# Patient Record
Sex: Female | Born: 1960 | Race: White | Hispanic: No | Marital: Married | Smoking: Former smoker
Health system: Southern US, Community
[De-identification: ages and names within clinical notes are randomized; demographics above are authoritative.]

## PROBLEM LIST (undated history)

## (undated) DIAGNOSIS — K760 Fatty (change of) liver, not elsewhere classified: Secondary | ICD-10-CM

## (undated) DIAGNOSIS — F1010 Alcohol abuse, uncomplicated: Secondary | ICD-10-CM

## (undated) DIAGNOSIS — M2550 Pain in unspecified joint: Secondary | ICD-10-CM

## (undated) DIAGNOSIS — R569 Unspecified convulsions: Secondary | ICD-10-CM

## (undated) DIAGNOSIS — N979 Female infertility, unspecified: Secondary | ICD-10-CM

## (undated) DIAGNOSIS — R600 Localized edema: Secondary | ICD-10-CM

## (undated) DIAGNOSIS — R002 Palpitations: Secondary | ICD-10-CM

## (undated) DIAGNOSIS — T7840XA Allergy, unspecified, initial encounter: Secondary | ICD-10-CM

## (undated) DIAGNOSIS — K219 Gastro-esophageal reflux disease without esophagitis: Secondary | ICD-10-CM

## (undated) DIAGNOSIS — M549 Dorsalgia, unspecified: Secondary | ICD-10-CM

## (undated) DIAGNOSIS — E785 Hyperlipidemia, unspecified: Secondary | ICD-10-CM

## (undated) DIAGNOSIS — N390 Urinary tract infection, site not specified: Secondary | ICD-10-CM

## (undated) DIAGNOSIS — F419 Anxiety disorder, unspecified: Secondary | ICD-10-CM

## (undated) DIAGNOSIS — K644 Residual hemorrhoidal skin tags: Secondary | ICD-10-CM

## (undated) DIAGNOSIS — I10 Essential (primary) hypertension: Secondary | ICD-10-CM

## (undated) DIAGNOSIS — R3989 Other symptoms and signs involving the genitourinary system: Secondary | ICD-10-CM

## (undated) HISTORY — DX: Dorsalgia, unspecified: M54.9

## (undated) HISTORY — DX: Female infertility, unspecified: N97.9

## (undated) HISTORY — DX: Other symptoms and signs involving the genitourinary system: R39.89

## (undated) HISTORY — PX: OTHER SURGICAL HISTORY: SHX169

## (undated) HISTORY — DX: Anxiety disorder, unspecified: F41.9

## (undated) HISTORY — DX: Residual hemorrhoidal skin tags: K64.4

## (undated) HISTORY — DX: Localized edema: R60.0

## (undated) HISTORY — DX: Hyperlipidemia, unspecified: E78.5

## (undated) HISTORY — DX: Unspecified convulsions: R56.9

## (undated) HISTORY — DX: Allergy, unspecified, initial encounter: T78.40XA

## (undated) HISTORY — DX: Palpitations: R00.2

## (undated) HISTORY — DX: Urinary tract infection, site not specified: N39.0

## (undated) HISTORY — DX: Fatty (change of) liver, not elsewhere classified: K76.0

## (undated) HISTORY — DX: Essential (primary) hypertension: I10

## (undated) HISTORY — DX: Alcohol abuse, uncomplicated: F10.10

## (undated) HISTORY — DX: Pain in unspecified joint: M25.50

## (undated) HISTORY — DX: Gastro-esophageal reflux disease without esophagitis: K21.9

---

## 1998-11-06 ENCOUNTER — Other Ambulatory Visit: Admission: RE | Admit: 1998-11-06 | Discharge: 1998-11-06 | Payer: Self-pay | Admitting: Obstetrics and Gynecology

## 2000-03-25 ENCOUNTER — Other Ambulatory Visit: Admission: RE | Admit: 2000-03-25 | Discharge: 2000-03-25 | Payer: Self-pay | Admitting: Obstetrics and Gynecology

## 2000-04-02 ENCOUNTER — Encounter: Payer: Self-pay | Admitting: Obstetrics and Gynecology

## 2000-04-02 ENCOUNTER — Encounter: Admission: RE | Admit: 2000-04-02 | Discharge: 2000-04-02 | Payer: Self-pay | Admitting: Obstetrics and Gynecology

## 2002-02-23 ENCOUNTER — Other Ambulatory Visit: Admission: RE | Admit: 2002-02-23 | Discharge: 2002-02-23 | Payer: Self-pay | Admitting: Obstetrics and Gynecology

## 2003-05-10 ENCOUNTER — Other Ambulatory Visit: Admission: RE | Admit: 2003-05-10 | Discharge: 2003-05-10 | Payer: Self-pay | Admitting: Obstetrics and Gynecology

## 2004-08-07 ENCOUNTER — Other Ambulatory Visit: Admission: RE | Admit: 2004-08-07 | Discharge: 2004-08-07 | Payer: Self-pay | Admitting: Obstetrics and Gynecology

## 2007-11-23 ENCOUNTER — Encounter: Admission: RE | Admit: 2007-11-23 | Discharge: 2007-11-23 | Payer: Self-pay | Admitting: Obstetrics and Gynecology

## 2009-06-06 ENCOUNTER — Encounter: Admission: RE | Admit: 2009-06-06 | Discharge: 2009-06-06 | Payer: Self-pay | Admitting: Obstetrics and Gynecology

## 2009-06-12 ENCOUNTER — Encounter: Admission: RE | Admit: 2009-06-12 | Discharge: 2009-06-12 | Payer: Self-pay | Admitting: Obstetrics and Gynecology

## 2011-03-11 ENCOUNTER — Other Ambulatory Visit: Payer: Self-pay | Admitting: Obstetrics and Gynecology

## 2011-03-11 DIAGNOSIS — Z803 Family history of malignant neoplasm of breast: Secondary | ICD-10-CM

## 2011-03-11 DIAGNOSIS — Z1231 Encounter for screening mammogram for malignant neoplasm of breast: Secondary | ICD-10-CM

## 2011-03-31 ENCOUNTER — Ambulatory Visit
Admission: RE | Admit: 2011-03-31 | Discharge: 2011-03-31 | Disposition: A | Discharge status: Home / Self Care | Payer: BC Managed Care – PPO | Source: Ambulatory Visit | Attending: Obstetrics and Gynecology | Admitting: Obstetrics and Gynecology

## 2011-03-31 DIAGNOSIS — Z803 Family history of malignant neoplasm of breast: Secondary | ICD-10-CM

## 2011-03-31 DIAGNOSIS — Z1231 Encounter for screening mammogram for malignant neoplasm of breast: Secondary | ICD-10-CM

## 2012-04-29 ENCOUNTER — Other Ambulatory Visit: Payer: Self-pay | Admitting: Obstetrics and Gynecology

## 2012-04-29 DIAGNOSIS — Z1231 Encounter for screening mammogram for malignant neoplasm of breast: Secondary | ICD-10-CM

## 2012-06-16 ENCOUNTER — Ambulatory Visit
Admission: RE | Admit: 2012-06-16 | Discharge: 2012-06-16 | Disposition: A | Discharge status: Home / Self Care | Payer: BC Managed Care – PPO | Source: Ambulatory Visit | Attending: Obstetrics and Gynecology | Admitting: Obstetrics and Gynecology

## 2012-06-16 DIAGNOSIS — Z1231 Encounter for screening mammogram for malignant neoplasm of breast: Secondary | ICD-10-CM

## 2012-07-14 ENCOUNTER — Other Ambulatory Visit: Payer: Self-pay | Admitting: Obstetrics and Gynecology

## 2013-08-17 ENCOUNTER — Other Ambulatory Visit: Payer: Self-pay

## 2013-08-17 ENCOUNTER — Encounter: Payer: Self-pay | Admitting: Gastroenterology

## 2013-08-17 DIAGNOSIS — Z1231 Encounter for screening mammogram for malignant neoplasm of breast: Secondary | ICD-10-CM

## 2013-09-04 ENCOUNTER — Ambulatory Visit: Payer: BC Managed Care – PPO

## 2013-09-06 ENCOUNTER — Ambulatory Visit
Admission: RE | Admit: 2013-09-06 | Discharge: 2013-09-06 | Disposition: A | Discharge status: Home / Self Care | Payer: BC Managed Care – PPO | Source: Ambulatory Visit

## 2013-09-06 DIAGNOSIS — Z1231 Encounter for screening mammogram for malignant neoplasm of breast: Secondary | ICD-10-CM

## 2013-09-13 ENCOUNTER — Other Ambulatory Visit: Payer: Self-pay | Admitting: Obstetrics and Gynecology

## 2013-10-03 ENCOUNTER — Ambulatory Visit (AMBULATORY_SURGERY_CENTER): Payer: Self-pay

## 2013-10-03 VITALS — Ht 65.0 in | Wt 180.6 lb

## 2013-10-03 DIAGNOSIS — Z800 Family history of malignant neoplasm of digestive organs: Secondary | ICD-10-CM

## 2013-10-03 DIAGNOSIS — Z8371 Family history of colonic polyps: Secondary | ICD-10-CM

## 2013-10-03 MED ORDER — MOVIPREP 100 G PO SOLR: ORAL | Status: DC

## 2013-10-03 NOTE — Progress Notes (Signed)
Per pt, no allergies to soy or egg products.Pt not taking any weight loss meds or using  O2 at home. 

## 2013-10-17 ENCOUNTER — Telehealth: Payer: Self-pay | Admitting: Gastroenterology

## 2013-10-17 NOTE — Telephone Encounter (Signed)
Called patient back and no answer.  Left message on voicemail for patient to call me back at [phone].

## 2013-10-17 NOTE — Telephone Encounter (Signed)
Pt. To cancel, husband was in an accident.

## 2013-10-18 ENCOUNTER — Telehealth: Payer: Self-pay | Admitting: *Deleted

## 2013-10-18 ENCOUNTER — Encounter: Payer: BC Managed Care – PPO | Admitting: Gastroenterology

## 2013-10-18 NOTE — Telephone Encounter (Signed)
Pt arrived for colonoscopy on 10/18/13, pt's BP was 202/130, pt states she was just started on BP meds (hydrochlorothiazide) which she did not take this am, pt complains of headache and does not feel completely well,BP was taken twice in L arm, dynomap machine would not take BP, advised Dr. Fuller Plan and Vallery Sa CRNA of BP, Dr Fuller Plan advised he would probably cancel procedure due to BP but would come speak with pt and family, husband came into recovery and sat with wife while they waited for Dr Fuller Plan, Dr Fuller Plan came in and spoke with pt and husband, cancelled procedure at this time, pt will call back and reschedule when BP is under control.-adm

## 2013-12-16 ENCOUNTER — Emergency Department (HOSPITAL_COMMUNITY): Payer: BC Managed Care – PPO

## 2013-12-16 ENCOUNTER — Encounter (HOSPITAL_COMMUNITY): Payer: Self-pay | Admitting: Emergency Medicine

## 2013-12-16 ENCOUNTER — Emergency Department (HOSPITAL_COMMUNITY)
Admission: EM | Admit: 2013-12-16 | Discharge: 2013-12-16 | Disposition: A | Discharge status: Home / Self Care | Payer: BC Managed Care – PPO | Attending: Emergency Medicine | Admitting: Emergency Medicine

## 2013-12-16 DIAGNOSIS — R748 Abnormal levels of other serum enzymes: Secondary | ICD-10-CM | POA: Insufficient documentation

## 2013-12-16 DIAGNOSIS — Z8639 Personal history of other endocrine, nutritional and metabolic disease: Secondary | ICD-10-CM | POA: Insufficient documentation

## 2013-12-16 DIAGNOSIS — R569 Unspecified convulsions: Secondary | ICD-10-CM | POA: Insufficient documentation

## 2013-12-16 DIAGNOSIS — Z7982 Long term (current) use of aspirin: Secondary | ICD-10-CM | POA: Insufficient documentation

## 2013-12-16 DIAGNOSIS — Z862 Personal history of diseases of the blood and blood-forming organs and certain disorders involving the immune mechanism: Secondary | ICD-10-CM | POA: Insufficient documentation

## 2013-12-16 DIAGNOSIS — Z91040 Latex allergy status: Secondary | ICD-10-CM | POA: Insufficient documentation

## 2013-12-16 DIAGNOSIS — G4730 Sleep apnea, unspecified: Secondary | ICD-10-CM | POA: Insufficient documentation

## 2013-12-16 DIAGNOSIS — Z79899 Other long term (current) drug therapy: Secondary | ICD-10-CM | POA: Insufficient documentation

## 2013-12-16 DIAGNOSIS — I10 Essential (primary) hypertension: Secondary | ICD-10-CM | POA: Insufficient documentation

## 2013-12-16 LAB — URINALYSIS, ROUTINE W REFLEX MICROSCOPIC
BILIRUBIN URINE: NEGATIVE
GLUCOSE, UA: NEGATIVE mg/dL
KETONES UR: NEGATIVE mg/dL
Leukocytes, UA: NEGATIVE
Nitrite: NEGATIVE
PH: 5.5 (ref 5.0–8.0)
Protein, ur: NEGATIVE mg/dL
SPECIFIC GRAVITY, URINE: 1.018 (ref 1.005–1.030)
Urobilinogen, UA: 0.2 mg/dL (ref 0.0–1.0)

## 2013-12-16 LAB — RAPID URINE DRUG SCREEN, HOSP PERFORMED
AMPHETAMINES: NOT DETECTED
BARBITURATES: NOT DETECTED
BENZODIAZEPINES: NOT DETECTED
COCAINE: NOT DETECTED
Opiates: NOT DETECTED
TETRAHYDROCANNABINOL: NOT DETECTED

## 2013-12-16 LAB — COMPREHENSIVE METABOLIC PANEL
ALBUMIN: 4.4 g/dL (ref 3.5–5.2)
ALK PHOS: 103 U/L (ref 39–117)
ALT: 92 U/L — ABNORMAL HIGH (ref 0–35)
AST: 114 U/L — AB (ref 0–37)
Anion gap: 17 — ABNORMAL HIGH (ref 5–15)
BILIRUBIN TOTAL: 1.5 mg/dL — AB (ref 0.3–1.2)
BUN: 23 mg/dL (ref 6–23)
CHLORIDE: 93 meq/L — AB (ref 96–112)
CO2: 26 meq/L (ref 19–32)
Calcium: 8.9 mg/dL (ref 8.4–10.5)
Creatinine, Ser: 0.84 mg/dL (ref 0.50–1.10)
GFR calc Af Amer: >90 mL/min (ref >90)
GFR, EST NON AFRICAN AMERICAN: 79 mL/min — AB (ref >90)
Glucose, Bld: 108 mg/dL — ABNORMAL HIGH (ref 70–99)
POTASSIUM: 3.4 meq/L — AB (ref 3.7–5.3)
Sodium: 136 mEq/L — ABNORMAL LOW (ref 137–147)
Total Protein: 7.5 g/dL (ref 6.0–8.3)

## 2013-12-16 LAB — CBC WITH DIFFERENTIAL/PLATELET
BASOS ABS: 0 10*3/uL (ref 0.0–0.1)
BASOS PCT: 0 % (ref 0–1)
Eosinophils Absolute: 0 10*3/uL (ref 0.0–0.7)
Eosinophils Relative: 0 % (ref 0–5)
HEMATOCRIT: 43.9 % (ref 36.0–46.0)
HEMOGLOBIN: 15.4 g/dL — AB (ref 12.0–15.0)
LYMPHS PCT: 15 % (ref 12–46)
Lymphs Abs: 1.5 10*3/uL (ref 0.7–4.0)
MCH: 30.6 pg (ref 26.0–34.0)
MCHC: 35.1 g/dL (ref 30.0–36.0)
MCV: 87.3 fL (ref 78.0–100.0)
MONO ABS: 0.9 10*3/uL (ref 0.1–1.0)
MONOS PCT: 9 % (ref 3–12)
NEUTROS ABS: 7.7 10*3/uL (ref 1.7–7.7)
NEUTROS PCT: 76 % (ref 43–77)
Platelets: 225 10*3/uL (ref 150–400)
RBC: 5.03 MIL/uL (ref 3.87–5.11)
RDW: 12.1 % (ref 11.5–15.5)
WBC: 10.2 10*3/uL (ref 4.0–10.5)

## 2013-12-16 LAB — URINE MICROSCOPIC-ADD ON

## 2013-12-16 LAB — ETHANOL: Alcohol, Ethyl (B): <11 mg/dL (ref 0–11)

## 2013-12-16 NOTE — ED Notes (Signed)
Pt arrived to the ED with a complaint of sleep apnea.  Pt's husband woke to find patient breathing oddly and was unresponsive to verbal and physical stimuli.  EMS was called but said they felt ok with husband driving her to ED

## 2013-12-16 NOTE — ED Notes (Signed)
Patient transported to CT 

## 2013-12-16 NOTE — ED Notes (Signed)
Per husband he awoke to wife's breathing very "forced" and heavy, he noted drool on face and around mouth, arms stiff, reports pt was unresponsive to any stimuli, facial stimulation, forced eye opening, and lights. Husband reports pupils dilated and not reactive when he opened her eyes. Pt reports she remembers going to bed then being frightened at seeing EMS. Husband states pt screamed and was very alarmed when she came to. Pt states she noted tongue trauma while getting ready to come to ED, denies urinary incotinence. Pt states several times recently she has had episodes of confusion and being frightened when she is awoken.  A & O at this time, denies change in etoh, does drink wine, not excessive. Pt did take Hydroxyzine just before bed.  Pupils equal. MAE

## 2013-12-16 NOTE — ED Notes (Signed)
Pt ambulatory to BR with steady gait, urine sample obtained

## 2013-12-16 NOTE — ED Provider Notes (Signed)
CSN: [id number]     Arrival date & time 12/16/13  0504 History   First MD Initiated Contact with Patient 12/16/13 [phone]     Chief Complaint  Patient presents with  . Sleep Apnea     (Consider location/radiation/quality/duration/timing/severity/associated sxs/prior Treatment) HPI Comments: Patient is a 53 year old female with history of hypertension, hyperlipidemia who presents to the emergency department today after her husband found her with very forced, deep breathing. There was drool noted on her face. He states that her body was very stiff and that she would not respond to facial stimulation or eye opening. He states that her pupils are very dilated and did not react when he turned the lights on. He states that when she did wake up she was confused for approximately 10 minutes. The patient reports going to bed feeling well and waking up with EMS around her. She felt confused at that time. This is not the first time that she is very frightened and confused when she has woken up. She currently feels significantly improved. She has no complaints at this time. The patient was started on new blood pressure medications one month ago, otherwise no new medications. The patient reports drinking approximately 1 glass of wine a day, but admits she was on vacation last week and drank more than normal. No drug use. No personal or family history of seizures.  The history is provided by the patient and the spouse. No language interpreter was used.    Past Medical History  Diagnosis Date  . Hypertension   . Hyperlipidemia   . Skin tags, anus or rectum    Past Surgical History  Procedure Laterality Date  . Dental implants     Family History  Problem Relation Age of Onset  . Breast cancer Mother   . Hypertension Mother   . Colon cancer Paternal Aunt   . Colon cancer Paternal Grandmother   . Hypertension Father   . Hypertension Brother   . Colon polyps Brother    History  Substance Use Topics  .  Smoking status: Never Smoker   . Smokeless tobacco: Never Used  . Alcohol Use: 7.2 - 8.4 oz/week    12-14 Glasses of wine per week   OB History   Grav Para Term Preterm Abortions TAB SAB Ect Mult Living                 Review of Systems  Constitutional: Negative for fever and chills.  Respiratory: Negative for shortness of breath.   Cardiovascular: Negative for chest pain.  Gastrointestinal: Negative for nausea, vomiting and abdominal pain.  Neurological: Positive for seizures (possible seizure like activity).  All other systems reviewed and are negative.     Allergies  Latex  Home Medications   Prior to Admission medications   Medication Sig Start Date End Date Taking? Authorizing Provider  amLODipine (NORVASC) 2.5 MG tablet Take 2.5 mg by mouth daily.   Yes Historical Provider, MD  aspirin 81 MG tablet Take 81 mg by mouth daily.   Yes Historical Provider, MD  Calcium-Magnesium-Vitamin D (CALCIUM MAGNESIUM PO) Take by mouth. Take 2 tablets daily   Yes Historical Provider, MD  hydrochlorothiazide (MICROZIDE) 12.5 MG capsule Take 12.5 mg by mouth daily.   Yes Historical Provider, MD  hydrOXYzine (ATARAX/VISTARIL) 25 MG tablet Take 25 mg by mouth 3 (three) times daily.    Yes Historical Provider, MD  KRILL OIL PO Take by mouth. Mega Red Krill Oil-Take one tablet daily  Yes Historical Provider, MD  Multiple Vitamin (MULTIVITAMIN) tablet Take 1 tablet by mouth daily.   Yes Historical Provider, MD   BP 132/78  Pulse 99  Temp(Src) 98.4 F (36.9 C) (Oral)  Resp 18  SpO2 99% Physical Exam  Nursing note and vitals reviewed. Constitutional: She is oriented to person, place, and time. She appears well-developed and well-nourished. She does not appear ill. No distress.  HENT:  Head: Normocephalic and atraumatic.  Right Ear: External ear normal.  Left Ear: External ear normal.  Nose: Nose normal.  Mouth/Throat: Oropharynx is clear and moist.  Small amount of tried blood in  mouth. No tongue laceration. No broken or loose teeth.   Eyes: Conjunctivae and EOM are normal. Pupils are equal, round, and reactive to light.  Neck: Normal range of motion. No spinous process tenderness and no muscular tenderness present. No rigidity.  Cardiovascular: Normal rate, regular rhythm, normal heart sounds, intact distal pulses and normal pulses.   Pulses:      Radial pulses are 2+ on the right side, and 2+ on the left side.       Posterior tibial pulses are 2+ on the right side, and 2+ on the left side.  Pulmonary/Chest: Effort normal and breath sounds normal. No stridor. No respiratory distress. She has no wheezes. She has no rales.  Abdominal: Soft. She exhibits no distension. There is no tenderness.  Musculoskeletal: Normal range of motion.  Moves all extremities without guarding or ataxia.   Neurological: She is alert and oriented to person, place, and time. She has normal strength. No sensory deficit. Coordination and gait normal. GCS eye subscore is 4. GCS verbal subscore is 5. GCS motor subscore is 6.  Grip strength 5/5 bilaterally. Strength 5/5 in all extremities. Normal gait.   Skin: Skin is warm and dry. She is not diaphoretic. No erythema.  Psychiatric: She has a normal mood and affect. Her behavior is normal.    ED Course  Procedures (including critical care time) Labs Review Labs Reviewed  CBC WITH DIFFERENTIAL - Abnormal; Notable for the following:    Hemoglobin 15.4 (*)    All other components within normal limits  COMPREHENSIVE METABOLIC PANEL - Abnormal; Notable for the following:    Sodium 136 (*)    Potassium 3.4 (*)    Chloride 93 (*)    Glucose, Bld 108 (*)    AST 114 (*)    ALT 92 (*)    Total Bilirubin 1.5 (*)    GFR calc non Af Amer 79 (*)    Anion gap 17 (*)    All other components within normal limits  URINALYSIS, ROUTINE W REFLEX MICROSCOPIC - Abnormal; Notable for the following:    Hgb urine dipstick TRACE (*)    All other components  within normal limits  ETHANOL  URINE RAPID DRUG SCREEN (HOSP PERFORMED)  URINE MICROSCOPIC-ADD ON    Imaging Review Ct Head Wo Contrast  12/16/2013   CLINICAL DATA:  SLEEP APNEA  EXAM: CT HEAD WITHOUT CONTRAST  TECHNIQUE: Contiguous axial images were obtained from the base of the skull through the vertex without intravenous contrast.  COMPARISON:  None.  FINDINGS: There is no evidence of acute intracranial hemorrhage, brain edema, mass lesion, acute infarction, mass effect, or midline shift. Acute infarct may be inapparent on noncontrast CT. No other intra-axial abnormalities are seen, and the ventricles and sulci are within normal limits in size and symmetry. No abnormal extra-axial fluid collections or masses are identified. No  significant calvarial abnormality.  IMPRESSION: Negative for bleed or other acute intracranial process.   Electronically Signed   By: Arne Cleveland M.D.   On: 12/16/2013 08:11     EKG Interpretation None      MDM   Final diagnoses:  Seizure-like activity  Elevated liver enzymes   Patient present to ED for evaluation of what appears to be seizure like activity. No family or personal history of seizures. Patient is alert and oriented x 4 and back to baseline. Workup here is unremarkable. Labs do show elevation in AST and ALT with AST>ALT. Encouraged patient to avoid alcohol and have liver enzymes rechecked. Patient instructed not to drive until she has been cleared by a neurologist. Discussed reasons to return to ED immediately. Vital signs stable for discharge. Discussed case with Dr. Wilson Singer who agrees with plan. Patient / Family / Caregiver informed of clinical course, understand medical decision-making process, and agree with plan.     Elwyn Lade, PA-C 12/16/13 1007

## 2013-12-16 NOTE — Discharge Instructions (Signed)
Please do not drive until you are seen and cleared by a neurologist. Please avoid alcohol and have your liver enzymes rechecked. Return to ED for any new or worsening symptoms.  Seizure, Adult A seizure means there is unusual activity in the brain. A seizure can cause changes in attention or behavior. Seizures often cause shaking (convulsions). Seizures often last from 30 seconds to 2 minutes. HOME CARE   If you are given medicines, take them exactly as told by your doctor.  Keep all doctor visits as told.  Do not swim or drive until your doctor says it is okay.  Teach others what to do if you have a seizure. They should:  Lay you on the ground.  Put a cushion under your head.  Loosen any tight clothing around your neck.  Turn you on your side.  Stay with you until you get better. GET HELP RIGHT AWAY IF:   The seizure lasts longer than 2 to 5 minutes.  The seizure is very bad.  The person does not wake up after the seizure.  The person's attention or behavior changes. Drive the person to the emergency room or call your local emergency services (911 in [place] MAKE SURE YOU:   Understand these instructions.  Will watch your condition.  Will get help right away if you are not doing well or get worse. Document Released: 10/28/2007 Document Revised: 08/03/2011 Document Reviewed: 04/29/2011 [facility] Patient Information 2015 [place], [place]. This information is not intended to replace advice given to you by your health care provider. Make sure you discuss any questions you have with your health care provider.

## 2013-12-16 NOTE — ED Notes (Signed)
Phlebotomy at bedside.

## 2013-12-20 ENCOUNTER — Ambulatory Visit (INDEPENDENT_AMBULATORY_CARE_PROVIDER_SITE_OTHER): Payer: BC Managed Care – PPO | Admitting: Neurology

## 2013-12-20 ENCOUNTER — Encounter: Payer: Self-pay | Admitting: Neurology

## 2013-12-20 VITALS — BP 158/100 | HR 74 | Ht 65.0 in | Wt 176.6 lb

## 2013-12-20 DIAGNOSIS — R569 Unspecified convulsions: Secondary | ICD-10-CM

## 2013-12-20 NOTE — ED Provider Notes (Signed)
Medical screening examination/treatment/procedure(s) were performed by non-physician practitioner and as supervising physician I was immediately available for consultation/collaboration.   EKG Interpretation None       Virgel Manifold, MD 12/20/13 1344

## 2013-12-20 NOTE — Patient Instructions (Addendum)
1. MRI brain with and without contrast August 7@11 :30 please arrive 58minutes prior for check in 2. Routine EEG, then schedule 48-hour EEG  Seizure Precautions: 1. If medication has been prescribed for you to prevent seizures, take it exactly as directed.  Do not stop taking the medicine without talking to your doctor first, even if you have not had a seizure in a long time.   2. Avoid activities in which a seizure would cause danger to yourself or to others.  Don't operate dangerous machinery, swim alone, or climb in high or dangerous places, such as on ladders, roofs, or girders.  Do not drive unless your doctor says you may.  3. If you have any warning that you may have a seizure, lay down in a safe place where you can't hurt yourself.    4.  No driving for 6 months from last seizure, as per [facility].   Please refer to the following link on the [place] website for more information: http://[email or web address].org/[email or web address]/driving/drivingu.cfm   5.  Maintain good sleep hygiene.  6.  Contact your doctor if you have any problems that may be related to the medicine you are taking.  7.  Call 911 and bring the patient back to the ED if:        A.  The seizure lasts longer than 5 minutes.       B.  The patient doesn't awaken shortly after the seizure  C.  The patient has new problems such as difficulty seeing, speaking or moving  D.  The patient was injured during the seizure  E.  The patient has a temperature over 102 F (39C)  F.  The patient vomited and now is having trouble breathing

## 2013-12-20 NOTE — Progress Notes (Signed)
NEUROLOGY CONSULTATION NOTE  ASHELYNN Holland MRN: [id number] DOB: 1961/03/29  Referring provider: Dr. Virgel Manifold Primary care provider: Dr. Thressa Sheller  Reason for consult:  New onset seizure  Dear Dr Wilson Singer:  Thank you for your kind referral of Kim Holland for consultation of the above symptoms. Although her history is well known to you, please allow me to reiterate it for the purpose of our medical record. The patient was accompanied to the clinic by her mother who also provides collateral information. Records and images were personally reviewed where available.  HISTORY OF PRESENT ILLNESS: This is a 53 year old left-handed woman with a history of hypertension, hyperlipidemia, anxiety, who had a nocturnal seizure early morning on 12/16/2013.  She denied feeling ill the day prior, no sleep deprivation. Her husband woke up to very forced, deep breathing with drool on her face. Her body was very stiff and she was unresponsive to facial stimulation/eye opening. Her pupils were noted to be very dilated.  When she alerted, she was confused for around 10 minutes. She has no recollection of events until she woke up to EMS standing around her. She had a tongue bite on the left side, no focal weakness.  She was brought to the ER where bloodwork showed normal CBC, CMP showed elevated AST of 114 and ALT 92. Alcohol and urine drug screen negative. I personally reviewed head CT without contrast which is normal.  She usually drinks approximately 2 glasses of wine a day, but admits she was on vacation last week and drank more than normal. She reports it was a particularly stressful week/day.  She reports episodes over the past 1-2 years where she would feel confused and terrified when her husband would walk into the room, occurring a couple of times a month. She would scream when she sees him, one time she got up and stumbled trying to go to the bathroom, "almost conscious."  She denies any  olfactory/gustatory hallucinations, focal numbness/tingling/weakness, myoclonic jerks.  She has residual sensation of vertigo/movement since they went on a boat trip 2 weeks ago.    She has a history of remote migraines with catamenial component, resolved after menopause. She however continues to have episodes of visual aura that do not progress to headaches, where it feels like someone puts a hand on her right eye with flashing lights, occurring around twice a year.  At age 53, she had migraines with associated right facial numbness.  She denies any diplopia, dysarthria, dysphagia, neck/back pain, bowel/bladder dysfunction.    Epilepsy Risk Factors: Her brother had seizures at age 54 and took Phenobarbital until age 48. Otherwise she had a normal birth and early development.  There is no history of febrile convulsions, CNS infections such as meningitis/encephalitis, significant traumatic brain injury, neurosurgical procedures, or family history of seizures.  PAST MEDICAL HISTORY: Past Medical History  Diagnosis Date  . Hypertension   . Hyperlipidemia   . Skin tags, anus or rectum     PAST SURGICAL HISTORY: Past Surgical History  Procedure Laterality Date  . Dental implants      MEDICATIONS: Current Outpatient Prescriptions on File Prior to Visit  Medication Sig Dispense Refill  . amLODipine (NORVASC) 2.5 MG tablet Take 2.5 mg by mouth daily.      Marland Kitchen aspirin 81 MG tablet Take 81 mg by mouth daily.      . Calcium-Magnesium-Vitamin D (CALCIUM MAGNESIUM PO) Take by mouth. Take 2 tablets daily      .  hydrochlorothiazide (MICROZIDE) 12.5 MG capsule Take 12.5 mg by mouth daily.      . hydrOXYzine (ATARAX/VISTARIL) 25 MG tablet Take 25 mg by mouth 3 (three) times daily.       Marland Kitchen KRILL OIL PO Take by mouth. Mega Red Krill Oil-Take one tablet daily      . Multiple Vitamin (MULTIVITAMIN) tablet Take 1 tablet by mouth daily.       No current facility-administered medications on file prior to visit.      ALLERGIES: Allergies  Allergen Reactions  . Latex     CAUSES HIVES,SWELLING    FAMILY HISTORY: Family History  Problem Relation Age of Onset  . Breast cancer Mother   . Hypertension Mother   . Colon cancer Paternal Aunt   . Colon cancer Paternal Grandmother   . Hypertension Father   . Hypertension Brother   . Colon polyps Brother     SOCIAL HISTORY: History   Social History  . Marital Status: Married    Spouse Name: N/A    Number of Children: N/A  . Years of Education: N/A   Occupational History  . Not on file.   Social History Main Topics  . Smoking status: Former Research scientist (life sciences)  . Smokeless tobacco: Never Used  . Alcohol Use: 7.2 - 8.4 oz/week    12-14 Glasses of wine per week  . Drug Use: No  . Sexual Activity: Not on file   Other Topics Concern  . Not on file   Social History Narrative  . No narrative on file    REVIEW OF SYSTEMS: Constitutional: No fevers, chills, or sweats, no generalized fatigue, change in appetite Eyes: No visual changes, double vision, eye pain Ear, nose and throat: No hearing loss, ear pain, nasal congestion, sore throat Cardiovascular: No chest pain, palpitations Respiratory:  No shortness of breath at rest or with exertion, wheezes GastrointestinaI: No nausea, vomiting, diarrhea, abdominal pain, fecal incontinence Genitourinary:  No dysuria, urinary retention or frequency Musculoskeletal:  No neck pain, back pain Integumentary: No rash, pruritus, skin lesions Neurological: as above Psychiatric: No depression, insomnia, anxiety Endocrine: No palpitations, fatigue, diaphoresis, mood swings, change in appetite, change in weight, increased thirst Hematologic/Lymphatic:  No anemia, purpura, petechiae. Allergic/Immunologic: no itchy/runny eyes, nasal congestion, recent allergic reactions, rashes  PHYSICAL EXAM: Filed Vitals:   12/20/13 1340  BP: 158/100  Pulse: 74   General: No acute distress, anxious Head:   Normocephalic/atraumatic Eyes: Fundoscopic exam shows bilateral sharp discs, no vessel changes, exudates, or hemorrhages Neck: supple, no paraspinal tenderness, full range of motion Back: No paraspinal tenderness Heart: regular rate and rhythm Lungs: Clear to auscultation bilaterally. Vascular: No carotid bruits. Skin/Extremities: No rash, no edema Neurological Exam: Mental status: alert and oriented to person, place, and time, no dysarthria or aphasia, Fund of knowledge is appropriate.  Recent and remote memory are intact.  Attention and concentration are normal.    Able to name objects and repeat phrases. Cranial nerves: CN I: not tested CN II: pupils equal, round and reactive to light, visual fields intact, fundi unremarkable. CN III, IV, VI:  full range of motion, no nystagmus, no ptosis CN V: facial sensation intact CN VII: upper and lower face symmetric CN VIII: hearing intact to finger rub CN IX, X: gag intact, uvula midline CN XI: sternocleidomastoid and trapezius muscles intact CN XII: tongue midline Bulk & Tone: normal, no fasciculations. Motor: 5/5 throughout with no pronator drift. Sensation: intact to light touch, cold, pin, vibration and joint position  sense.  No extinction to double simultaneous stimulation.  Romberg test negative Deep Tendon Reflexes: +2 throughout, no ankle clonus Plantar responses: downgoing bilaterally Cerebellar: no incoordination on finger to nose, heel to shin. No dysdiadochokinesia Gait: narrow-based and steady, able to tandem walk adequately. Tremor: none  IMPRESSION: This is a 53 year old left-handed woman with a history of hypertension, migraines, presenting with new onset nocturnal seizure last 12/16/2013.  This occurred in the setting of increased alcohol intake. Bloodwork shows elevated liver enzymes. Neurological exam and head CT unremarkable. She also reports episodes of disorientation and feeling terrified.  MRI brain with and without  contrast and routine EEG will be ordered to assess for focal abnormalities that increase risk for recurrent seizures.  We discussed that after an initial seizure, unless there are significant risk factors, an abnormal neurological exam, an EEG showing epileptiform abnormalities, and/or abnormal neuroimaging, treatment with an antiepileptic drug is not indicated.  If routine EEG normal, ambulatory EEG will be ordered to further classify her symptoms.  We discussed [place] driving restrictions which indicate a patient needs to free of seizures or events of altered awareness for 6 months prior to resuming driving. The patient agreed to comply with these restrictions.  Seizure precautions were discussed which include no driving, no bathing in a tub, no swimming alone, no cooking over an open flame, no operating dangerous machinery, and no activities which may endanger oneself or someone else. She will follow-up after the tests.  Thank you for allowing me to participate in the care of this patient. Please do not hesitate to call for any questions or concerns.   Kim Holland, M.D.  CC: Dr. Noah Delaine

## 2013-12-21 ENCOUNTER — Ambulatory Visit (INDEPENDENT_AMBULATORY_CARE_PROVIDER_SITE_OTHER): Payer: BC Managed Care – PPO | Admitting: Neurology

## 2013-12-21 DIAGNOSIS — R569 Unspecified convulsions: Secondary | ICD-10-CM

## 2013-12-21 NOTE — Procedures (Signed)
ELECTROENCEPHALOGRAM REPORT  Date of Study: 12/21/2013  Patient's Name: MARLI DIEGO MRN: [id number] Date of Birth: [birth year]  Referring Provider: Dr. Ellouise Newer  Clinical History: This is a 53 year old woman with new onset convulsive seizure, who also has recurrent episodes of disorientation and feeling terrified.  EEG for classification.  Medications: Norvasc, HCTZ, aspirin  Technical Summary: A multichannel digital EEG recording measured by the international 10-20 system with electrodes applied with paste and impedances below 5000 ohms performed in our laboratory with EKG monitoring in an awake and asleep patient.  Hyperventilation and photic stimulation were performed.  The digital EEG was referentially recorded, reformatted, and digitally filtered in a variety of bipolar and referential montages for optimal display.    Description: The patient is awake and asleep during the recording.  During maximal wakefulness, there is a symmetric, medium voltage 9.5 Hz posterior dominant rhythm that attenuates with eye opening.  The record is symmetric.  During drowsiness and sleep, there is an increase in theta slowing of the background, at times with shifting asymmetry over the bilateral temporal regions, right greater than left.  Vertex waves and symmetric sleep spindles were seen.  Hyperventilation and photic stimulation did not elicit any abnormalities.  There were no epileptiform discharges or electrographic seizures seen.    EKG lead was unremarkable.  Impression: This awake and asleep EEG is within normal limits.  Clinical Correlation: A normal EEG does not exclude a clinical diagnosis of epilepsy.  If further clinical questions remain, prolonged EEG may be helpful.  Clinical correlation is advised.   Ellouise Newer, M.D.

## 2013-12-25 ENCOUNTER — Encounter: Payer: Self-pay | Admitting: Neurology

## 2013-12-25 DIAGNOSIS — R569 Unspecified convulsions: Secondary | ICD-10-CM | POA: Insufficient documentation

## 2013-12-25 DIAGNOSIS — I10 Essential (primary) hypertension: Secondary | ICD-10-CM | POA: Insufficient documentation

## 2013-12-26 ENCOUNTER — Other Ambulatory Visit: Payer: Self-pay | Admitting: Internal Medicine

## 2013-12-26 DIAGNOSIS — R7401 Elevation of levels of liver transaminase levels: Secondary | ICD-10-CM

## 2013-12-26 DIAGNOSIS — R740 Nonspecific elevation of levels of transaminase and lactic acid dehydrogenase [LDH]: Principal | ICD-10-CM

## 2014-01-02 ENCOUNTER — Other Ambulatory Visit: Payer: BC Managed Care – PPO

## 2014-01-02 ENCOUNTER — Telehealth: Payer: Self-pay | Admitting: Neurology

## 2014-01-02 NOTE — Telephone Encounter (Signed)
Pt called wanting to f/u on the results for the MRI she had done on Friday 12/29/13. Please call [phone]

## 2014-01-02 NOTE — Telephone Encounter (Signed)
Returned call. Advised patient that result wasn't in yet, we will call her once Dr. Delice Lesch receives report to let her know result.

## 2014-01-03 NOTE — Telephone Encounter (Signed)
Pt is returning your call please call [phone]

## 2014-01-03 NOTE — Telephone Encounter (Signed)
I spoke with patient notified her of MRI results.

## 2014-01-03 NOTE — Telephone Encounter (Signed)
Patient notified of results.

## 2014-01-03 NOTE — Telephone Encounter (Signed)
Message copied by Thurmon Fair on Wed Jan 03, 2014  2:45 PM ------      Message from: Cameron Sprang      Created: Wed Jan 03, 2014  1:19 PM      Regarding: MRI results       Pls let her know I reviewed MRI brain and it is unremarkable, no evidence of tumor, stroke, or bleed. Thanks ------

## 2014-01-11 ENCOUNTER — Ambulatory Visit
Admission: RE | Admit: 2014-01-11 | Discharge: 2014-01-11 | Disposition: A | Discharge status: Home / Self Care | Payer: BC Managed Care – PPO | Source: Ambulatory Visit | Attending: Internal Medicine | Admitting: Internal Medicine

## 2014-01-11 DIAGNOSIS — R740 Nonspecific elevation of levels of transaminase and lactic acid dehydrogenase [LDH]: Principal | ICD-10-CM

## 2014-01-11 DIAGNOSIS — R7402 Elevation of levels of lactic acid dehydrogenase (LDH): Secondary | ICD-10-CM

## 2014-01-12 ENCOUNTER — Encounter: Payer: Self-pay | Admitting: Neurology

## 2014-02-12 ENCOUNTER — Other Ambulatory Visit: Payer: BC Managed Care – PPO

## 2014-02-13 ENCOUNTER — Ambulatory Visit (INDEPENDENT_AMBULATORY_CARE_PROVIDER_SITE_OTHER): Payer: BC Managed Care – PPO | Admitting: Neurology

## 2014-02-13 DIAGNOSIS — R569 Unspecified convulsions: Secondary | ICD-10-CM

## 2014-02-19 ENCOUNTER — Ambulatory Visit (INDEPENDENT_AMBULATORY_CARE_PROVIDER_SITE_OTHER): Payer: BC Managed Care – PPO | Admitting: Neurology

## 2014-02-19 ENCOUNTER — Encounter: Payer: Self-pay | Admitting: Neurology

## 2014-02-19 VITALS — BP 128/92 | HR 73 | Resp 16 | Ht 65.0 in | Wt 182.0 lb

## 2014-02-19 DIAGNOSIS — R569 Unspecified convulsions: Secondary | ICD-10-CM

## 2014-02-19 MED ORDER — LAMOTRIGINE ER 25 MG PO TB24: ORAL_TABLET | ORAL | Status: DC

## 2014-02-19 NOTE — Progress Notes (Signed)
NEUROLOGY FOLLOW UP OFFICE NOTE  Kim Holland [id number]  HISTORY OF PRESENT ILLNESS: I had the pleasure of seeing Kim Holland in follow-up in the neurology clinic on 02/19/2014. She is again accompanied by her mother today. The patient was last seen 2 months ago for new onset nocturnal seizure last 12/16/13. Records and images were personally reviewed where available.  I personally reviewed MRI brain with and without contrast which showed a few scattered bilateral subcortical T2 hyperintensities, no abnormal enhancement, hippocampi symmetric with no abnormal signal or enhancement.  Her routine and 24-hour EEG were normal, typical events were not captured. She had reported episodes where she would feel confused and terrified when her husband walks into the room, this has not recurred since her last visit. No further seizures or seizure-like symptoms since July.  She reports doing well, she is less anxious than her previous visit.   HPI:  This is a 53 yo LH woman with a history of hypertension, hyperlipidemia, anxiety, who had a nocturnal seizure early morning on 12/16/2013. She denied feeling ill the day prior, no sleep deprivation. Her husband woke up to very forced, deep breathing with drool on her face. Her body was very stiff and she was unresponsive to facial stimulation/eye opening. Her pupils were noted to be very dilated. When she alerted, she was confused for around 10 minutes. She has no recollection of events until she woke up to EMS standing around her. She had a tongue bite on the left side, no focal weakness. She was brought to the ER where bloodwork showed normal CBC, CMP showed elevated AST of 114 and ALT 92. Alcohol and urine drug screen negative. I personally reviewed head CT without contrast which is normal. She usually drinks approximately 2 glasses of wine a day, but admits she was on vacation last week and drank more than normal. She reports it was a particularly stressful  week/day.   She reports episodes over the past 1-2 years where she would feel confused and terrified when her husband would walk into the room, occurring a couple of times a month. She would scream when she sees him, one time she got up and stumbled trying to go to the bathroom, "almost conscious."   Epilepsy Risk Factors: Her brother had seizures at age 18 and took Phenobarbital until age 53. Otherwise she had a normal birth and early development. There is no history of febrile convulsions, CNS infections such as meningitis/encephalitis, significant traumatic brain injury, neurosurgical procedures, or family history of seizures.  PAST MEDICAL HISTORY: Past Medical History  Diagnosis Date  . Hypertension   . Hyperlipidemia   . Skin tags, anus or rectum     MEDICATIONS: Current Outpatient Prescriptions on File Prior to Visit  Medication Sig Dispense Refill  . aspirin 81 MG tablet Take 81 mg by mouth daily.      . Calcium-Magnesium-Vitamin D (CALCIUM MAGNESIUM PO) Take by mouth. Take 2 tablets daily      . hydrochlorothiazide (MICROZIDE) 12.5 MG capsule Take 12.5 mg by mouth daily.      . hydrOXYzine (ATARAX/VISTARIL) 25 MG tablet Take 25 mg by mouth 3 (three) times daily.       Marland Kitchen KRILL OIL PO Take by mouth. Mega Red Krill Oil-Take one tablet daily      . Multiple Vitamin (MULTIVITAMIN) tablet Take 1 tablet by mouth daily.       No current facility-administered medications on file prior to visit.    ALLERGIES: Allergies  Allergen Reactions  . Latex     CAUSES HIVES,SWELLING    FAMILY HISTORY: Family History  Problem Relation Age of Onset  . Breast cancer Mother   . Hypertension Mother   . Colon cancer Paternal Aunt   . Colon cancer Paternal Grandmother   . Hypertension Father   . Hypertension Brother   . Colon polyps Brother     SOCIAL HISTORY: History   Social History  . Marital Status: Married    Spouse Name: N/A    Number of Children: N/A  . Years of Education:  N/A   Occupational History  . Not on file.   Social History Main Topics  . Smoking status: Former Research scientist (life sciences)  . Smokeless tobacco: Never Used  . Alcohol Use: 7.2 - 8.4 oz/week    12-14 Glasses of wine per week  . Drug Use: No  . Sexual Activity: Not on file   Other Topics Concern  . Not on file   Social History Narrative  . No narrative on file    REVIEW OF SYSTEMS: Constitutional: No fevers, chills, or sweats, no generalized fatigue, change in appetite Eyes: No visual changes, double vision, eye pain Ear, nose and throat: No hearing loss, ear pain, nasal congestion, sore throat Cardiovascular: No chest pain, palpitations Respiratory:  No shortness of breath at rest or with exertion, wheezes GastrointestinaI: No nausea, vomiting, diarrhea, abdominal pain, fecal incontinence Genitourinary:  No dysuria, urinary retention or frequency Musculoskeletal:  No neck pain, back pain Integumentary: No rash, pruritus, skin lesions Neurological: as above Psychiatric: No depression, insomnia, +anxiety Endocrine: No palpitations, fatigue, diaphoresis, mood swings, change in appetite, change in weight, increased thirst Hematologic/Lymphatic:  No anemia, purpura, petechiae. Allergic/Immunologic: no itchy/runny eyes, nasal congestion, recent allergic reactions, rashes  PHYSICAL EXAM: Filed Vitals:   02/19/14 1248  BP: 128/92  Pulse: 73  Resp: 16   General: No acute distress Head:  Normocephalic/atraumatic Neck: supple, no paraspinal tenderness, full range of motion Heart:  Regular rate and rhythm Lungs:  Clear to auscultation bilaterally Back: No paraspinal tenderness Skin/Extremities: No rash, no edema Neurological Exam: alert and oriented to person, place, and time. No aphasia or dysarthria. Fund of knowledge is appropriate.  Recent and remote memory are intact.  Attention and concentration are normal.    Able to name objects and repeat phrases. Cranial nerves: Pupils equal, round,  reactive to light.  Fundoscopic exam unremarkable, no papilledema. Extraocular movements intact with no nystagmus. Visual fields full. Facial sensation intact. No facial asymmetry. Tongue, uvula, palate midline.  Motor: Bulk and tone normal, muscle strength 5/5 throughout with no pronator drift.  Sensation to light touch, temperature and vibration intact.  No extinction to double simultaneous stimulation.  Deep tendon reflexes 2+ throughout, toes downgoing.  Finger to nose testing intact.  Gait narrow-based and steady, able to tandem walk adequately.  Romberg negative.  IMPRESSION: This is a 53 yo LH woman with a history of hypertension, migraines, new onset nocturnal seizure last 12/16/2013. This occurred in the setting of increased alcohol intake. She also reports episodes of disorientation and feeling terrified. MRI brain with and without contrast and routine and 24-hour EEG unremarkable, however typical events were not captured.  We again discussed that after an initial seizure, unless there are significant risk factors, an abnormal neurological exam, an EEG showing epileptiform abnormalities, and/or abnormal neuroimaging, treatment with an antiepileptic drug is not indicated. We discussed recurrence risks, and she is anxious and understandably would like to prevent seizure recurrence.  She is opting to start seizure medication at this time.  She will start Lamictal XR with slow uptitration, side effects were discussed, including Kathreen Cosier syndrome. She will monitor if the episodes of disorientation decrease on medication. This may help with mood as well.  She understands [place] driving restrictions which indicate a patient needs to free of seizures or events of altered awareness for 6 months prior to resuming driving. She will follow-up in 6 weeks and knows to call our office for any problems in the interim.  Thank you for allowing me to participate in her care.  Please do not hesitate to call for any  questions or concerns.  The duration of this appointment visit was 25 minutes of face-to-face time with the patient.  Greater than 50% of this time was spent in counseling, explanation of diagnosis, planning of further management, and coordination of care.   Ellouise Newer, M.D.   CC: Dr. Noah Delaine

## 2014-02-19 NOTE — Patient Instructions (Signed)
1. Start Lamictal XR 25mg : Take 1 tablet daily for 2 weeks, then increase to 2 tabs daily for 2 weeks, then 4 tabs daily and continue 2. Continue to monitor symptoms, call our office for any changes 3. Follow-up in 6 weeks

## 2014-02-19 NOTE — Procedures (Signed)
ELECTROENCEPHALOGRAM REPORT  Dates of Recording: 02/13/2014 to 02/15/2014  Patient's Name: Kim Holland MRN: [id number] Date of Birth: [birth year]  Referring Provider: Dr. Ellouise Newer  Procedure: 48-hour ambulatory EEG  History: This is a 53 year old woman with new onset convulsive seizure, who also has recurrent episodes of disorientation and feeling terrified. EEG for classification.  Medications: Norvasc, HCTZ, aspirin  Technical Summary: This is a 48-hour multichannel digital EEG recording measured by the international 10-20 system with electrodes applied with paste and impedances below 5000 ohms performed as portable with EKG monitoring.  The digital EEG was referentially recorded, reformatted, and digitally filtered in a variety of bipolar and referential montages for optimal display.    DESCRIPTION OF RECORDING: During maximal wakefulness, the background activity consisted of a symmetric 10 Hz posterior dominant rhythm which was reactive to eye opening.  There were no epileptiform discharges or focal slowing seen in wakefulness.  During the recording, the patient progresses through wakefulness, drowsiness, and Stage 2 sleep.  Again, there were no epileptiform discharges seen.  Events: There were no push button events. On 09/22 at 1342 hours, she reports a slight tension headache. Electrographically, there were no EEG or EKG changes seen.  There were no electrographic seizures seen.  EKG lead was unremarkable.  IMPRESSION: This 48-hour ambulatory EEG study is normal.    CLINICAL CORRELATION: A normal EEG does not exclude a clinical diagnosis of epilepsy. Typical events were not captured.  If further clinical questions remain, inpatient video EEG monitoring may be helpful. Clinical correlation is advised.   Ellouise Newer, M.D.

## 2014-04-09 ENCOUNTER — Ambulatory Visit (INDEPENDENT_AMBULATORY_CARE_PROVIDER_SITE_OTHER): Payer: BC Managed Care – PPO | Admitting: Neurology

## 2014-04-09 VITALS — BP 130/80 | HR 77 | Resp 16 | Ht 65.0 in | Wt 184.8 lb

## 2014-04-09 DIAGNOSIS — R569 Unspecified convulsions: Secondary | ICD-10-CM

## 2014-04-09 NOTE — Patient Instructions (Signed)
1. Follow-up in 3 months, call our office for any change in symptoms 2. As per [place] driving laws, one should not drive until 6 months seizure-free

## 2014-04-10 ENCOUNTER — Encounter: Payer: Self-pay | Admitting: Neurology

## 2014-04-10 NOTE — Progress Notes (Signed)
NEUROLOGY FOLLOW UP OFFICE NOTE  Kim Holland [id number]  HISTORY OF PRESENT ILLNESS: I had the pleasure of seeing Kim Holland in follow-up in the neurology clinic on 04/09/2014.  The patient was last seen 2 months ago after she had a nocturnal seizure on 12/16/13. She is again accompanied by her mother today. On her last visit, she expressed wanting to start seizure medication. We had discussed that after an initial seizure with normal exam, imaging, and EEG, seizure medication is not indicated. She wanted to start Lamictal due to anxiety of having another seizure, however did not start the medication. She denies any seizures since 12/16/13. She had reported episodes of waking up confused and terrified, none in the past 2 months. She denies any headaches, dizziness, diplopia, focal numbness/tingling/weakness.   HPI: This is a 53 yo LH woman with a history of hypertension, hyperlipidemia, anxiety, who had a nocturnal seizure early morning on 12/16/2013. She denied feeling ill the day prior, no sleep deprivation. Her husband woke up to very forced, deep breathing with drool on her face. Her body was very stiff and she was unresponsive to facial stimulation/eye opening. Her pupils were noted to be very dilated. When she alerted, she was confused for around 10 minutes. She has no recollection of events until she woke up to EMS standing around her. She had a tongue bite on the left side, no focal weakness. She was brought to the ER where bloodwork showed normal CBC, CMP showed elevated AST of 114 and ALT 92. Alcohol and urine drug screen negative. I personally reviewed head CT without contrast which is normal. She usually drinks approximately 2 glasses of wine a day, but admits she was on vacation last week and drank more than normal. She reports it was a particularly stressful week/day.   She reports episodes over the past 1-2 years where she would feel confused and terrified when her husband would  walk into the room, occurring a couple of times a month. She would scream when she sees him, one time she got up and stumbled trying to go to the bathroom, "almost conscious."   Epilepsy Risk Factors: Her brother had seizures at age 89 and took Phenobarbital until age 106. Otherwise she had a normal birth and early development. There is no history of febrile convulsions, CNS infections such as meningitis/encephalitis, significant traumatic brain injury, neurosurgical procedures, or family history of seizures.  Diagnostic Data: MRI brain with and without contrast which showed a few scattered bilateral subcortical T2 hyperintensities, no abnormal enhancement, hippocampi symmetric with no abnormal signal or enhancement.  Her routine and 24-hour EEG were normal, typical events were not captured.   PAST MEDICAL HISTORY: Past Medical History  Diagnosis Date  . Hypertension   . Hyperlipidemia   . Skin tags, anus or rectum     MEDICATIONS: Current Outpatient Prescriptions on File Prior to Visit  Medication Sig Dispense Refill  . amLODipine (NORVASC) 5 MG tablet Take 5 mg by mouth daily.    Marland Kitchen aspirin 81 MG tablet Take 81 mg by mouth daily.    . Calcium-Magnesium-Vitamin D (CALCIUM MAGNESIUM PO) Take by mouth. Take 2 tablets daily    . hydrochlorothiazide (MICROZIDE) 12.5 MG capsule Take 12.5 mg by mouth daily.    . hydrOXYzine (ATARAX/VISTARIL) 25 MG tablet Take 25 mg by mouth 3 (three) times daily.     Marland Kitchen KRILL OIL PO Take by mouth. Mega Red Krill Oil-Take one tablet daily    . Multiple  Vitamin (MULTIVITAMIN) tablet Take 1 tablet by mouth daily.     No current facility-administered medications on file prior to visit.    ALLERGIES: Allergies  Allergen Reactions  . Latex     CAUSES HIVES,SWELLING    FAMILY HISTORY: Family History  Problem Relation Age of Onset  . Breast cancer Mother   . Hypertension Mother   . Colon cancer Paternal Aunt   . Colon cancer Paternal Grandmother   .  Hypertension Father   . Hypertension Brother   . Colon polyps Brother     SOCIAL HISTORY: History   Social History  . Marital Status: Married    Spouse Name: N/A    Number of Children: N/A  . Years of Education: N/A   Occupational History  . Not on file.   Social History Main Topics  . Smoking status: Former Research scientist (life sciences)  . Smokeless tobacco: Never Used  . Alcohol Use: 7.2 - 8.4 oz/week    12-14 Glasses of wine per week  . Drug Use: No  . Sexual Activity: Not on file   Other Topics Concern  . Not on file   Social History Narrative  . No narrative on file    REVIEW OF SYSTEMS: Constitutional: No fevers, chills, or sweats, no generalized fatigue, change in appetite Eyes: No visual changes, double vision, eye pain Ear, nose and throat: No hearing loss, ear pain, nasal congestion, sore throat Cardiovascular: No chest pain, palpitations Respiratory:  No shortness of breath at rest or with exertion, wheezes GastrointestinaI: No nausea, vomiting, diarrhea, abdominal pain, fecal incontinence Genitourinary:  No dysuria, urinary retention or frequency Musculoskeletal:  No neck pain, back pain Integumentary: No rash, pruritus, skin lesions Neurological: as above Psychiatric: No depression, insomnia, +anxiety Endocrine: No palpitations, fatigue, diaphoresis, mood swings, change in appetite, change in weight, increased thirst Hematologic/Lymphatic:  No anemia, purpura, petechiae. Allergic/Immunologic: no itchy/runny eyes, nasal congestion, recent allergic reactions, rashes  PHYSICAL EXAM: Filed Vitals:   04/09/14 1310  BP: 130/80  Pulse: 77  Resp: 16   General: No acute distress Head:  Normocephalic/atraumatic Neck: supple, no paraspinal tenderness, full range of motion Heart:  Regular rate and rhythm Lungs:  Clear to auscultation bilaterally Back: No paraspinal tenderness Skin/Extremities: No rash, no edema Neurological Exam: alert and oriented to person, place, and time.  No aphasia or dysarthria. Fund of knowledge is appropriate.  Recent and remote memory are intact.  Attention and concentration are normal.    Able to name objects and repeat phrases. Cranial nerves: Pupils equal, round, reactive to light.  Fundoscopic exam unremarkable, no papilledema. Extraocular movements intact with no nystagmus. Visual fields full. Facial sensation intact. No facial asymmetry. Tongue, uvula, palate midline.  Motor: Bulk and tone normal, muscle strength 5/5 throughout with no pronator drift.  Sensation to light touch intact.  No extinction to double simultaneous stimulation.  Deep tendon reflexes 2+ throughout, toes downgoing.  Finger to nose testing intact.  Gait narrow-based and steady, able to tandem walk adequately.  Romberg negative.  IMPRESSION: This is a 53 yo LH woman with a history of hypertension, migraines, new onset nocturnal seizure last 12/16/2013. This occurred in the setting of increased alcohol intake. She also reported episodes of disorientation and feeling terrified on awakening, none in the past 2 months. No further similar episodes or seizures. MRI brain with and without contrast and routine and 24-hour EEG unremarkable, however typical events were not captured. We again discussed recurrence risk after an initial seizure, she agrees with  close clinical monitoring off medication. She knows to call our office for any change in symptoms. She understands [place] driving restrictions which indicate a patient needs to free of seizures or events of altered awareness for 6 months prior to resuming driving. She will follow-up in 3 months and knows to call our office for any problems in the interim.  Thank you for allowing me to participate in her care.  Please do not hesitate to call for any questions or concerns.  The duration of this appointment visit was 15 minutes of face-to-face time with the patient.  Greater than 50% of this time was spent in counseling, explanation of  diagnosis, planning of further management, and coordination of care.   Ellouise Newer, M.D.   CC: Dr. Noah Delaine

## 2014-07-10 ENCOUNTER — Encounter: Payer: Self-pay | Admitting: Neurology

## 2014-07-10 ENCOUNTER — Ambulatory Visit (INDEPENDENT_AMBULATORY_CARE_PROVIDER_SITE_OTHER): Payer: BLUE CROSS/BLUE SHIELD | Admitting: Neurology

## 2014-07-10 VITALS — BP 110/84 | HR 90 | Resp 16 | Ht 65.0 in | Wt 184.0 lb

## 2014-07-10 DIAGNOSIS — R569 Unspecified convulsions: Secondary | ICD-10-CM

## 2014-07-10 NOTE — Progress Notes (Signed)
NEUROLOGY FOLLOW UP OFFICE NOTE  Kim Holland [id number]  HISTORY OF PRESENT ILLNESS: I had the pleasure of seeing Nakeyia Menden in follow-up in the neurology clinic on 07/10/2014.  The patient was last seen 3 months ago after she had a nocturnal seizure on 12/16/13. No further similar events since then. MRI brain and 24-hour EEG normal. She had also reported episodes of feeling confused and terrified early in the morning, and has had a couple in the past 3 months. No clear triggers, she usually sleeps well with 7-8 hours of sleep, possibly stress-related. She has reduced alcohol intake. No daytime confusional episodes, she denies any headaches, dizziness, diplopia, focal numbness/tingling/weakness, gaps in time, staring/unresponsive episodes. She is back to driving.   HPI: This is a 54 yo LH woman with a history of hypertension, hyperlipidemia, anxiety, who had a nocturnal seizure early morning on 12/16/2013. She denied feeling ill the day prior, no sleep deprivation. Her husband woke up to very forced, deep breathing with drool on her face. Her body was very stiff and she was unresponsive to facial stimulation/eye opening. Her pupils were noted to be very dilated. When she alerted, she was confused for around 10 minutes. She has no recollection of events until she woke up to EMS standing around her. She had a tongue bite on the left side, no focal weakness. She was brought to the ER where bloodwork showed normal CBC, CMP showed elevated AST of 114 and ALT 92. Alcohol and urine drug screen negative. I personally reviewed head CT without contrast which is normal. She usually drinks approximately 2 glasses of wine a day, but admits she was on vacation last week and drank more than normal. She reports it was a particularly stressful week/day.   She reports episodes over the past 1-2 years where she would feel confused and terrified when her husband would walk into the room, occurring a couple of  times a month. She would scream when she sees him, one time she got up and stumbled trying to go to the bathroom, "almost conscious."   Epilepsy Risk Factors: Her brother had seizures at age 65 and took Phenobarbital until age 40. Otherwise she had a normal birth and early development. There is no history of febrile convulsions, CNS infections such as meningitis/encephalitis, significant traumatic brain injury, neurosurgical procedures, or family history of seizures.  Diagnostic Data: MRI brain with and without contrast which showed a few scattered bilateral subcortical T2 hyperintensities, no abnormal enhancement, hippocampi symmetric with no abnormal signal or enhancement.  Her routine and 24-hour EEG were normal, typical events were not captured.   PAST MEDICAL HISTORY: Past Medical History  Diagnosis Date  . Hypertension   . Hyperlipidemia   . Skin tags, anus or rectum     MEDICATIONS: Current Outpatient Prescriptions on File Prior to Visit  Medication Sig Dispense Refill  . amLODipine (NORVASC) 5 MG tablet Take 5 mg by mouth daily.    Marland Kitchen aspirin 81 MG tablet Take 81 mg by mouth daily.    . Calcium-Magnesium-Vitamin D (CALCIUM MAGNESIUM PO) Take by mouth. Take 2 tablets daily    . hydrochlorothiazide (MICROZIDE) 12.5 MG capsule Take 12.5 mg by mouth daily.    . hydrOXYzine (ATARAX/VISTARIL) 25 MG tablet Take 25 mg by mouth 3 (three) times daily.     Marland Kitchen KRILL OIL PO Take by mouth. Mega Red Krill Oil-Take one tablet daily    . Multiple Vitamin (MULTIVITAMIN) tablet Take 1 tablet by mouth daily.  No current facility-administered medications on file prior to visit.    ALLERGIES: Allergies  Allergen Reactions  . Latex     CAUSES HIVES,SWELLING    FAMILY HISTORY: Family History  Problem Relation Age of Onset  . Breast cancer Mother   . Hypertension Mother   . Colon cancer Paternal Aunt   . Colon cancer Paternal Grandmother   . Hypertension Father   . Hypertension Brother    . Colon polyps Brother     SOCIAL HISTORY: History   Social History  . Marital Status: Married    Spouse Name: N/A  . Number of Children: N/A  . Years of Education: N/A   Occupational History  . Not on file.   Social History Main Topics  . Smoking status: Former Research scientist (life sciences)  . Smokeless tobacco: Never Used  . Alcohol Use: 7.2 - 8.4 oz/week    12-14 Glasses of wine per week  . Drug Use: No  . Sexual Activity: Not on file   Other Topics Concern  . Not on file   Social History Narrative    REVIEW OF SYSTEMS: Constitutional: No fevers, chills, or sweats, no generalized fatigue, change in appetite Eyes: No visual changes, double vision, eye pain Ear, nose and throat: No hearing loss, ear pain, nasal congestion, sore throat Cardiovascular: No chest pain, palpitations Respiratory:  No shortness of breath at rest or with exertion, wheezes GastrointestinaI: No nausea, vomiting, diarrhea, abdominal pain, fecal incontinence Genitourinary:  No dysuria, urinary retention or frequency Musculoskeletal:  No neck pain, back pain Integumentary: No rash, pruritus, skin lesions Neurological: as above Psychiatric: No depression, insomnia, anxiety Endocrine: No palpitations, fatigue, diaphoresis, mood swings, change in appetite, change in weight, increased thirst Hematologic/Lymphatic:  No anemia, purpura, petechiae. Allergic/Immunologic: no itchy/runny eyes, nasal congestion, recent allergic reactions, rashes  PHYSICAL EXAM: Filed Vitals:   07/10/14 1100  BP: 110/84  Pulse: 90  Resp: 16   General: No acute distress Head:  Normocephalic/atraumatic Neck: supple, no paraspinal tenderness, full range of motion Heart:  Regular rate and rhythm Lungs:  Clear to auscultation bilaterally Back: No paraspinal tenderness Skin/Extremities: No rash, no edema Neurological Exam: alert and oriented to person, place, and time. No aphasia or dysarthria. Fund of knowledge is appropriate.  Recent and  remote memory are intact.  Attention and concentration are normal.    Able to name objects and repeat phrases. Cranial nerves: Pupils equal, round, reactive to light.  Fundoscopic exam unremarkable, no papilledema. Extraocular movements intact with no nystagmus. Visual fields full. Facial sensation intact. No facial asymmetry. Tongue, uvula, palate midline.  Motor: Bulk and tone normal, muscle strength 5/5 throughout with no pronator drift.  Sensation to light touch intact.  No extinction to double simultaneous stimulation.  Deep tendon reflexes 2+ throughout, toes downgoing.  Finger to nose testing intact.  Gait narrow-based and steady, able to tandem walk adequately.  Romberg negative.  IMPRESSION: This is a 54 yo LH woman with a history of hypertension, migraines, new onset nocturnal seizure last 12/16/2013. This occurred in the setting of increased alcohol intake. MRI brain with and without contrast and routine and 24-hour EEG unremarkable, however typical events were not captured. No similar episodes since July. No indication for starting anti-seizure medication at this time. She also reported episodes of disorientation and feeling terrified on awakening, possibly confusional arousals. She will keep a calendar of these to try to identify triggers. She knows to call our office for any change in symptoms. She understands [place] driving  restrictions which indicate a patient needs to free of seizures or events of altered awareness for 6 months prior to resuming driving, she is back to driving. She will follow-up in 6 months and knows to call our office for any problems in the interim.  Thank you for allowing me to participate in her care.  Please do not hesitate to call for any questions or concerns.  The duration of this appointment visit was 15 minutes of face-to-face time with the patient.  Greater than 50% of this time was spent in counseling, explanation of diagnosis, planning of further management, and  coordination of care.   Ellouise Newer, M.D.   CC: Dr. Noah Delaine

## 2014-07-10 NOTE — Patient Instructions (Signed)
1. Continue to monitor your symptoms, keep a diary 2. Follow-up in 6 months, call our office for any changes

## 2015-01-08 ENCOUNTER — Ambulatory Visit: Payer: BLUE CROSS/BLUE SHIELD | Admitting: Neurology

## 2015-02-15 ENCOUNTER — Other Ambulatory Visit: Payer: Self-pay

## 2015-02-15 DIAGNOSIS — Z1231 Encounter for screening mammogram for malignant neoplasm of breast: Secondary | ICD-10-CM

## 2015-02-21 ENCOUNTER — Ambulatory Visit
Admission: RE | Admit: 2015-02-21 | Discharge: 2015-02-21 | Disposition: A | Discharge status: Home / Self Care | Payer: BLUE CROSS/BLUE SHIELD | Source: Ambulatory Visit

## 2015-02-21 DIAGNOSIS — Z1231 Encounter for screening mammogram for malignant neoplasm of breast: Secondary | ICD-10-CM

## 2016-03-28 IMAGING — CT CT HEAD W/O CM
2 series · 16 of 30 positions shown, 20 images · non-contrast
Comparison: None.

CLINICAL DATA: SLEEP APNEA

EXAM:
CT HEAD WITHOUT CONTRAST
TECHNIQUE: Contiguous axial images were obtained from the base of the skull
through the vertex without intravenous contrast.

[Series 2: head w/o · axial · non-contrast · 0.45mm/px · z∈[-143,-23]mm · 13 of 29 slices shown, 17 images]
[im 3/29  brain]
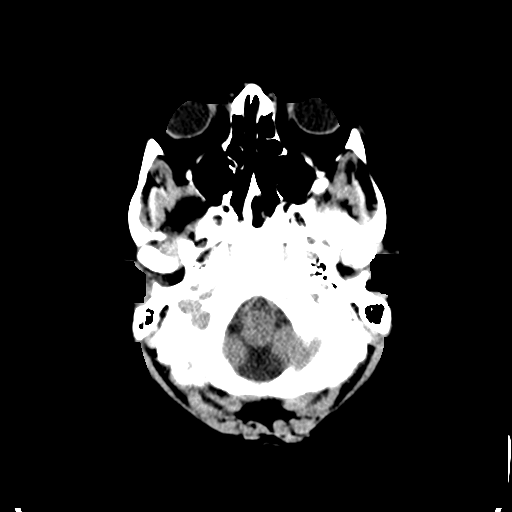
[im 3/29  bone]
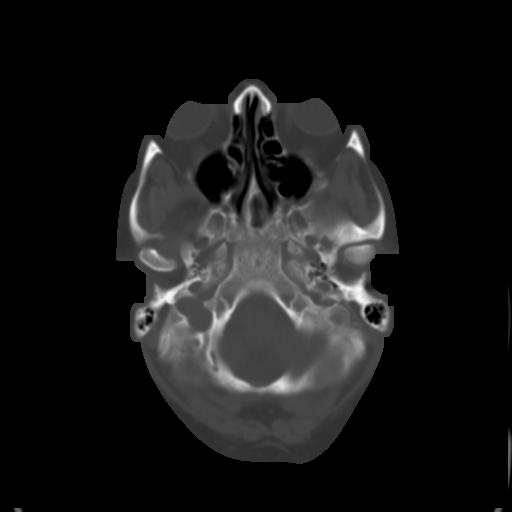
[im 5/29  brain]
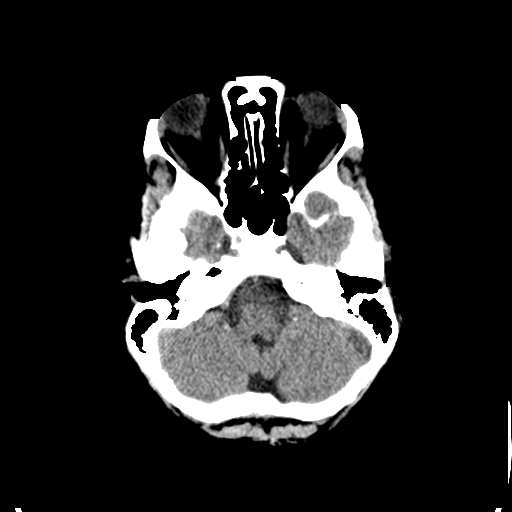
[im 7/29  brain]
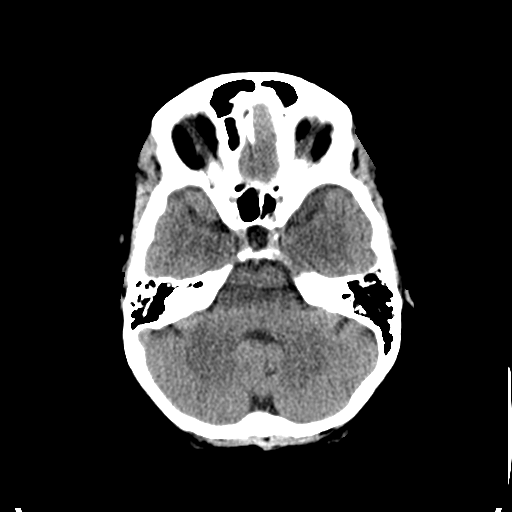
[im 9/29  brain]
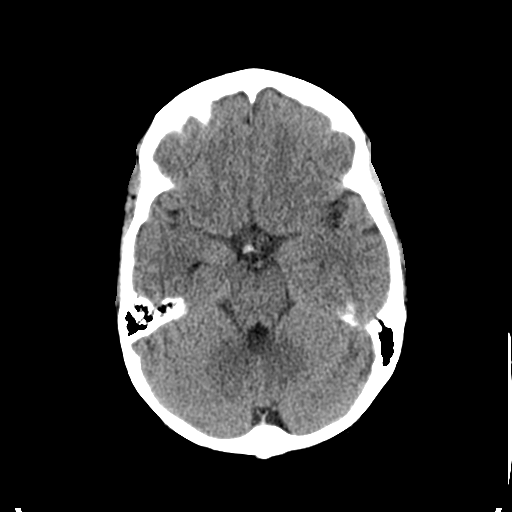
[im 11/29  brain]
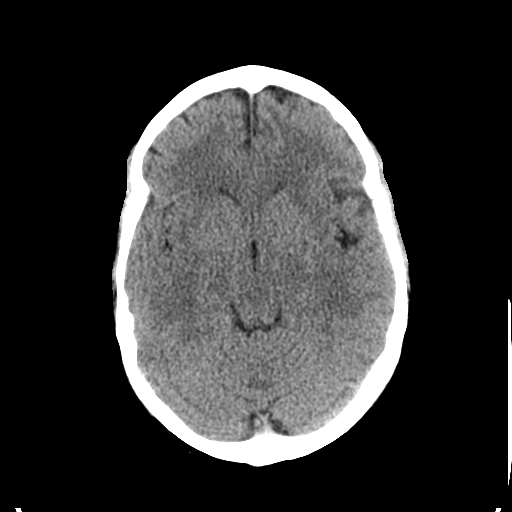
[im 11/29  bone]
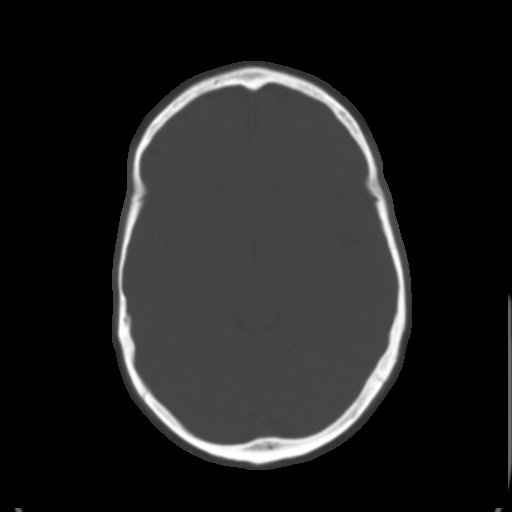
[im 13/29  brain]
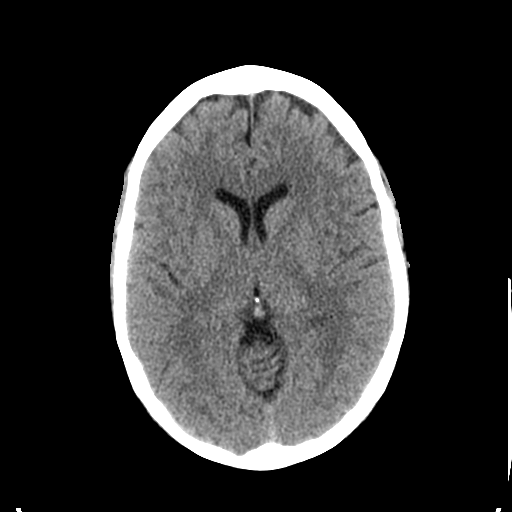
[im 15/29  brain]
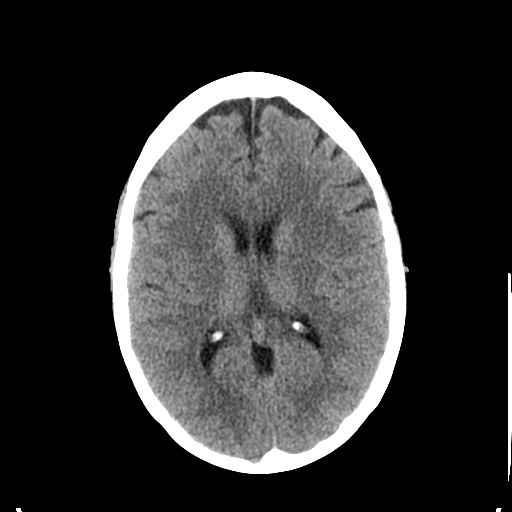
[im 17/29  brain]
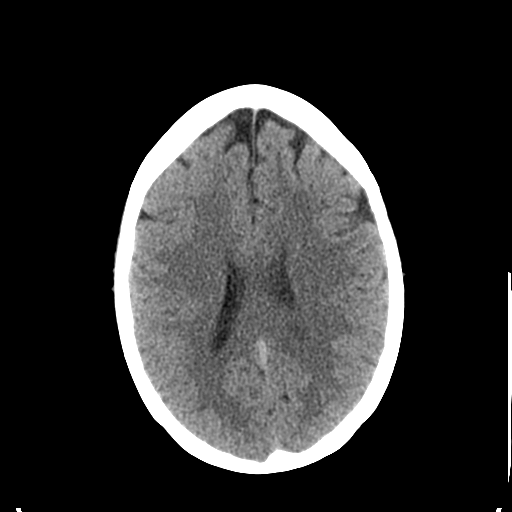
[im 19/29  brain]
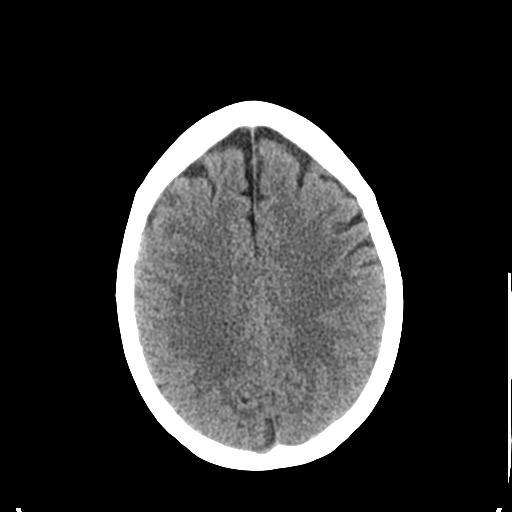
[im 19/29  bone]
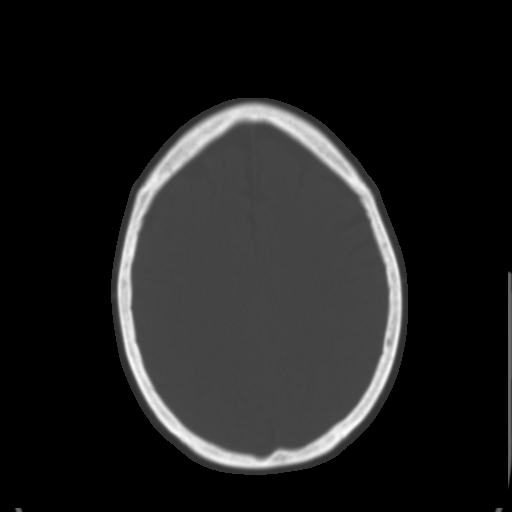
[im 21/29  brain]
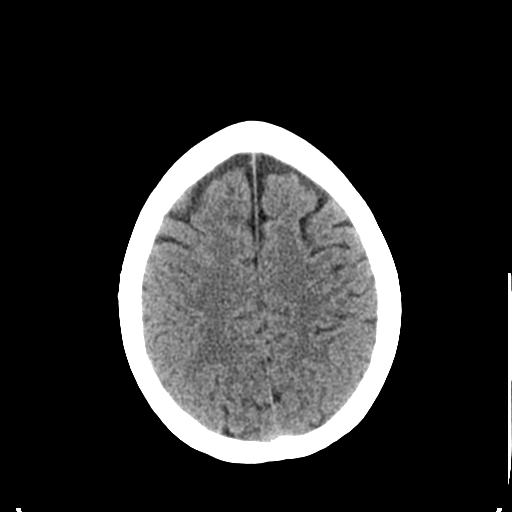
[im 23/29  brain]
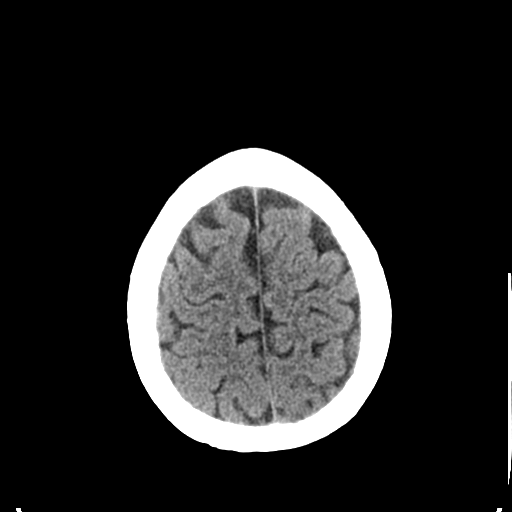
[im 25/29  brain]
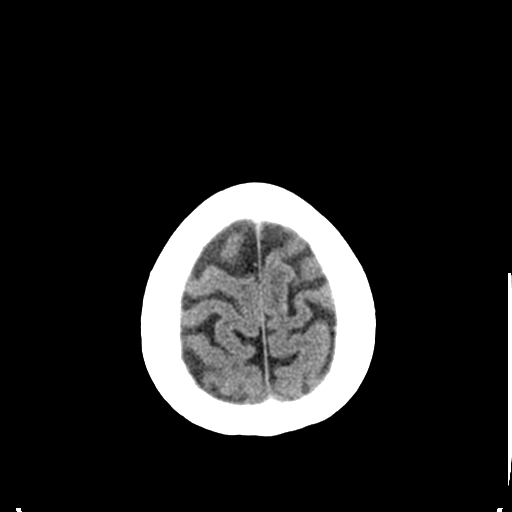
[im 27/29  brain]
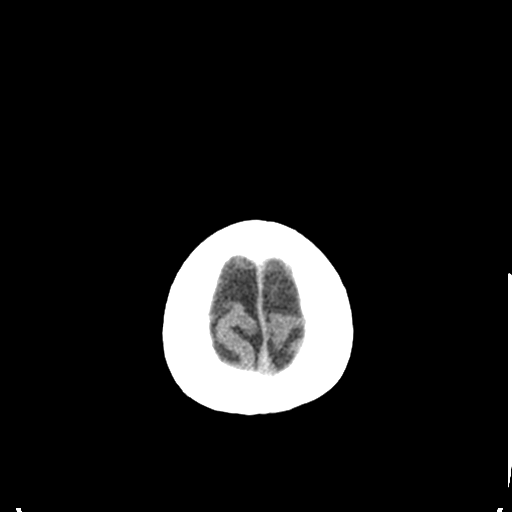
[im 27/29  bone]
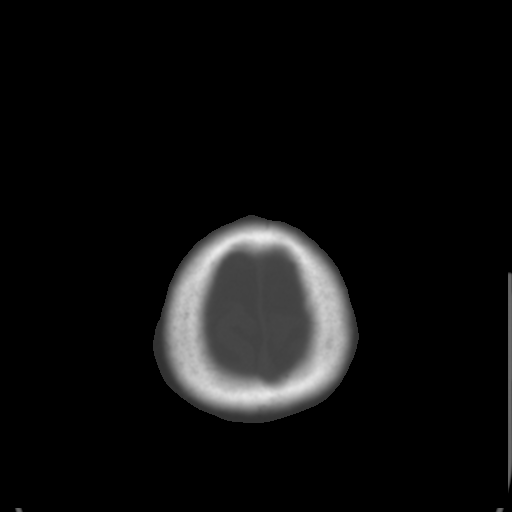

[Series 3: bone windows · axial · 0.45mm/px · z∈[-143,-103]mm · 3 of 29 slices shown]
[im 3/29  bone]
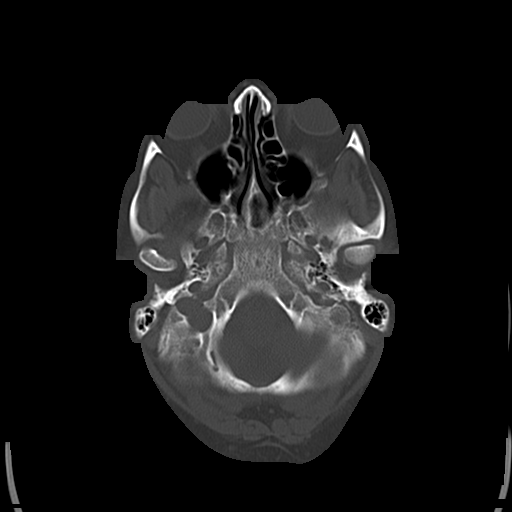
[im 7/29  bone]
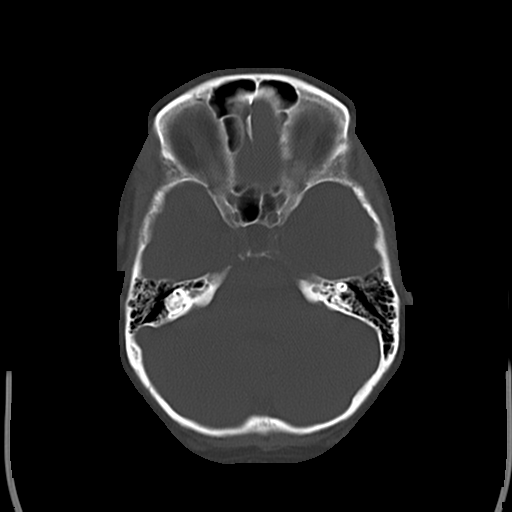
[im 11/29  bone]
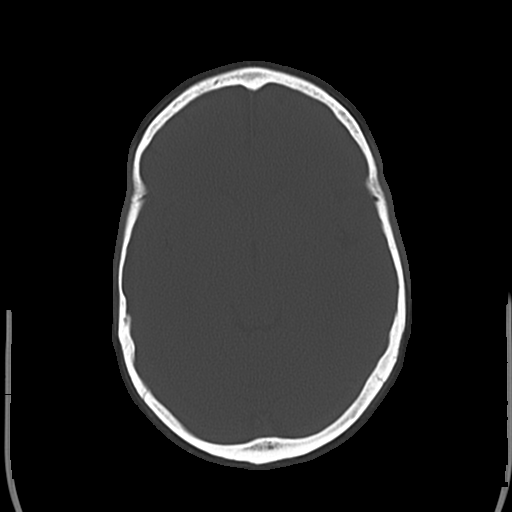

[16 of 30 positions shown; findings below may reference images not displayed]

FINDINGS: There is no evidence of acute intracranial hemorrhage, brain edema,
mass lesion, acute infarction, mass effect, or midline shift. Acute
infarct may be inapparent on noncontrast CT. No other intra-axial
abnormalities are seen, and the ventricles and sulci are within
normal limits in size and symmetry. No abnormal extra-axial fluid
collections or masses are identified. No significant calvarial
abnormality.
IMPRESSION: Negative for bleed or other acute intracranial process.

## 2016-07-09 ENCOUNTER — Other Ambulatory Visit: Payer: Self-pay | Admitting: Obstetrics and Gynecology

## 2016-07-09 DIAGNOSIS — Z1231 Encounter for screening mammogram for malignant neoplasm of breast: Secondary | ICD-10-CM

## 2016-07-23 ENCOUNTER — Ambulatory Visit
Admission: RE | Admit: 2016-07-23 | Discharge: 2016-07-23 | Disposition: A | Discharge status: Home / Self Care | Payer: 59 | Source: Ambulatory Visit | Attending: Obstetrics and Gynecology | Admitting: Obstetrics and Gynecology

## 2016-07-23 DIAGNOSIS — Z1231 Encounter for screening mammogram for malignant neoplasm of breast: Secondary | ICD-10-CM

## 2017-05-27 ENCOUNTER — Encounter: Payer: 59 | Admitting: Gastroenterology

## 2017-06-11 ENCOUNTER — Ambulatory Visit (AMBULATORY_SURGERY_CENTER): Payer: Self-pay | Admitting: *Deleted

## 2017-06-11 ENCOUNTER — Other Ambulatory Visit: Payer: Self-pay

## 2017-06-11 ENCOUNTER — Telehealth: Payer: Self-pay | Admitting: *Deleted

## 2017-06-11 ENCOUNTER — Encounter: Payer: Self-pay | Admitting: Gastroenterology

## 2017-06-11 VITALS — Ht 65.0 in | Wt 179.6 lb

## 2017-06-11 DIAGNOSIS — Z1211 Encounter for screening for malignant neoplasm of colon: Secondary | ICD-10-CM

## 2017-06-11 MED ORDER — PEG 3350-KCL-NA BICARB-NACL 420 G PO SOLR: 4000.0000 mL | Freq: Once | ORAL | 0 refills | Status: AC

## 2017-06-11 MED ORDER — NA SULFATE-K SULFATE-MG SULF 17.5-3.13-1.6 GM/177ML PO SOLN: 1.0000 [IU] | Freq: Once | ORAL | 0 refills | Status: AC

## 2017-06-11 NOTE — Telephone Encounter (Signed)
Dr. Fuller Plan.  The patient was in for a PV today and stated that she was to have this 3 years ago and came in after doing her prep and her BP was extremely high.  You cancelled her procedure due to this.  She is a very anxious person and is concerned about her BP being elevated again when she comes in on 06/22/17.  She is also anxious about having the sedation which she thinks is why her BP was up before.  She is wanting to know if you can prescribe  something to help her with her being anxious the night before and morning of.  I advised her that we normally do not do this but I would send you a message regarding this,  and someone will call her once we hear back from you.  Thank you.  B.Benitez, CMA  PV

## 2017-06-11 NOTE — Addendum Note (Signed)
Addended by: Ronelle Nigh on: 06/11/2017 03:02 PM   Modules accepted: Orders

## 2017-06-11 NOTE — Telephone Encounter (Signed)
She needs to come in for a BP check to determine if her BP is adequately controlled on current medication. She needs to take her BP medication as prescribed every day including the day of the prep and the day of the procedure. If her BP check is ok I will consider giving her an anxiolytic prior to her procedure.

## 2017-06-11 NOTE — Progress Notes (Signed)
No egg or soy allergy known to patient  No issues with past sedation with any surgeries  or procedures, no intubation problems  No diet pills per patient No home 02 use per patient  No blood thinners per patient  Pt denies issues with constipation  No A fib or A flutter  EMMI video sent to pt's e mail pt. Declined very anxious

## 2017-06-16 ENCOUNTER — Telehealth: Payer: Self-pay

## 2017-06-16 ENCOUNTER — Encounter: Payer: Self-pay | Admitting: Gastroenterology

## 2017-06-16 ENCOUNTER — Ambulatory Visit: Payer: 59 | Admitting: Gastroenterology

## 2017-06-16 VITALS — BP 150/90

## 2017-06-16 DIAGNOSIS — Z1211 Encounter for screening for malignant neoplasm of colon: Secondary | ICD-10-CM

## 2017-06-16 NOTE — Telephone Encounter (Signed)
-----   Message from Ladene Artist, MD sent at 06/16/2017 10:18 AM EST ----- Provide Xanax 0.25 mg po x 1 on day of procedure.  Advise her to follow up with her PCP regarding her BP. Advise her to take her BP meds as prescribed including the day of her procedure.    ----- Message ----- From: Audrea Muscat, CMA Sent: 06/16/2017  10:12 AM To: Ladene Artist, MD  Just wanted to let you know I checked this patient's blood pressure this morning.  She verbalized anxiety even then and pressure was 150/90.  She is still interested in getting some sort of anxiety medication the night before and the morning of her procedure or she is worried she will not be able to proceed.  Just fyi!!  :)

## 2017-06-17 MED ORDER — ALPRAZOLAM 0.25 MG PO TABS: ORAL_TABLET | ORAL | 0 refills | Status: DC

## 2017-06-17 NOTE — Telephone Encounter (Signed)
See T.E. From Marlon Pel, [place].  Rx. Phoned in for patient.

## 2017-06-17 NOTE — Telephone Encounter (Signed)
Informed patient that the prescription for Xanax we will send to her pharmacy and to take it the day of her procedure. Also informed her to follow up with her PCP regarding her BP and to take her BP meds the morning of her procedure. Patient verbalized understanding.

## 2017-06-17 NOTE — Addendum Note (Signed)
Addended by: Marzella Schlein on: 06/17/2017 09:31 AM   Modules accepted: Orders

## 2017-06-22 ENCOUNTER — Encounter: Payer: Self-pay | Admitting: Gastroenterology

## 2017-06-22 ENCOUNTER — Ambulatory Visit (AMBULATORY_SURGERY_CENTER): Payer: 59 | Admitting: Gastroenterology

## 2017-06-22 ENCOUNTER — Other Ambulatory Visit: Payer: Self-pay

## 2017-06-22 VITALS — BP 134/80 | HR 70 | Temp 97.8°F | Resp 18 | Ht 65.0 in | Wt 179.0 lb

## 2017-06-22 DIAGNOSIS — Z1212 Encounter for screening for malignant neoplasm of rectum: Secondary | ICD-10-CM

## 2017-06-22 DIAGNOSIS — D124 Benign neoplasm of descending colon: Secondary | ICD-10-CM

## 2017-06-22 DIAGNOSIS — K635 Polyp of colon: Secondary | ICD-10-CM | POA: Diagnosis not present

## 2017-06-22 DIAGNOSIS — D125 Benign neoplasm of sigmoid colon: Secondary | ICD-10-CM | POA: Diagnosis not present

## 2017-06-22 DIAGNOSIS — D126 Benign neoplasm of colon, unspecified: Secondary | ICD-10-CM

## 2017-06-22 DIAGNOSIS — Z1211 Encounter for screening for malignant neoplasm of colon: Secondary | ICD-10-CM

## 2017-06-22 MED ORDER — SODIUM CHLORIDE 0.9 % IV SOLN: 500.0000 mL | Freq: Once | INTRAVENOUS | Status: DC

## 2017-06-22 NOTE — Progress Notes (Signed)
Pt's states no medical or surgical changes since previsit or office visit. 

## 2017-06-22 NOTE — Progress Notes (Signed)
Report to PACU, RN, vss, BBS= Clear.  

## 2017-06-22 NOTE — Op Note (Signed)
[place] Patient Name: Kim Holland Procedure Date: 06/22/2017 9:02 AM MRN: [id number] Endoscopist: Ladene Artist , MD Age: 57 Referring MD:  Date of Birth: 26-Oct-1960 Gender: Female Account #: [id number] Procedure:                Colonoscopy Indications:              Screening for colorectal malignant neoplasm Medicines:                Monitored Anesthesia Care Procedure:                Pre-Anesthesia Assessment:                           - Prior to the procedure, a History and Physical                            was performed, and patient medications and                            allergies were reviewed. The patient's tolerance of                            previous anesthesia was also reviewed. The risks                            and benefits of the procedure and the sedation                            options and risks were discussed with the patient.                            All questions were answered, and informed consent                            was obtained. Prior Anticoagulants: The patient has                            taken no previous anticoagulant or antiplatelet                            agents. ASA Grade Assessment: II - A patient with                            mild systemic disease. After reviewing the risks                            and benefits, the patient was deemed in                            satisfactory condition to undergo the procedure.                           After obtaining informed consent, the colonoscope  was passed under direct vision. Throughout the                            procedure, the patient's blood pressure, pulse, and                            oxygen saturations were monitored continuously. The                            Model PCF-[id number]) scope was introduced                            through the anus and advanced to the the cecum,                            identified by  appendiceal orifice and ileocecal                            valve. The ileocecal valve, appendiceal orifice,                            and rectum were photographed. The quality of the                            bowel preparation was excellent. The colonoscopy                            was performed without difficulty. The patient                            tolerated the procedure well. Scope In: 9:08:25 AM Scope Out: 9:22:28 AM Scope Withdrawal Time: 0 hours 11 minutes 28 seconds  Total Procedure Duration: 0 hours 14 minutes 3 seconds  Findings:                 The perianal and digital rectal examinations were                            normal.                           A single localized non-bleeding erosion was found                            in the cecum. No stigmata of recent bleeding were                            seen.                           Two sessile polyps were found in the sigmoid colon                            and descending colon. The polyps were 5 to 6 mm in  size. These polyps were removed with a cold snare.                            Resection and retrieval were complete.                           A few small-mouthed diverticula were found in the                            left colon. There was no evidence of diverticular                            bleeding.                           The exam was otherwise without abnormality on                            direct and retroflexion views. Complications:            No immediate complications. Estimated blood loss:                            None. Estimated Blood Loss:     Estimated blood loss: none. Impression:               - Two 5 to 6 mm polyps in the sigmoid colon and in                            the descending colon, removed with a cold snare.                            Resected and retrieved.                           - Single non specific erosion in the cecum                            - Mild diverticulosis in the left colon. There was                            no evidence of diverticular bleeding.                           - The examination was otherwise normal on direct                            and retroflexion views. Recommendation:           - Repeat colonoscopy in 5 years for surveillance if                            polyp(s) are precancerous, otherwise 10 years .                           - Patient  has a contact number available for                            emergencies. The signs and symptoms of potential                            delayed complications were discussed with the                            patient. Return to normal activities tomorrow.                            Written discharge instructions were provided to the                            patient.                           - Resume previous diet.                           - Continue present medications.                           - Await pathology results. Ladene Artist, MD 06/22/2017 9:26:23 AM This report has been signed electronically.

## 2017-06-22 NOTE — Progress Notes (Signed)
Called to room to assist during endoscopic procedure.  Patient ID and intended procedure confirmed with present staff. Received instructions for my participation in the procedure from the performing physician.  

## 2017-06-22 NOTE — Patient Instructions (Signed)
YOU HAD AN ENDOSCOPIC PROCEDURE TODAY AT [facility]:   Refer to the procedure report that was given to you for any specific questions about what was found during the examination.  If the procedure report does not answer your questions, please call your gastroenterologist to clarify.  If you requested that your care partner not be given the details of your procedure findings, then the procedure report has been included in a sealed envelope for you to review at your convenience later.  YOU SHOULD EXPECT: Some feelings of bloating in the abdomen. Passage of more gas than usual.  Walking can help get rid of the air that was put into your GI tract during the procedure and reduce the bloating. If you had a lower endoscopy (such as a colonoscopy or flexible sigmoidoscopy) you may notice spotting of blood in your stool or on the toilet paper. If you underwent a bowel prep for your procedure, you may not have a normal bowel movement for a few days.  Please Note:  You might notice some irritation and congestion in your nose or some drainage.  This is from the oxygen used during your procedure.  There is no need for concern and it should clear up in a day or so.  SYMPTOMS TO REPORT IMMEDIATELY:   Following lower endoscopy (colonoscopy or flexible sigmoidoscopy):  Excessive amounts of blood in the stool  Significant tenderness or worsening of abdominal pains  Swelling of the abdomen that is new, acute  Fever of 100F or higher  For urgent or emergent issues, a gastroenterologist can be reached at any hour by calling [phone].   DIET:  We do recommend a small meal at first, but then you may proceed to your regular diet.  Drink plenty of fluids but you should avoid alcoholic beverages for 24 hours.  ACTIVITY:  You should plan to take it easy for the rest of today and you should NOT DRIVE or use heavy machinery until tomorrow (because of the sedation medicines used during the test).     FOLLOW UP: Our staff will call the number listed on your records the next business day following your procedure to check on you and address any questions or concerns that you may have regarding the information given to you following your procedure. If we do not reach you, we will leave a message.  However, if you are feeling well and you are not experiencing any problems, there is no need to return our call.  We will assume that you have returned to your regular daily activities without incident.  If any biopsies were taken you will be contacted by phone or by letter within the next 1-3 weeks.  Please call us at [phone] if you have not heard about the biopsies in 3 weeks.   Await for biopsy results to determine next repeat Colonoscopy screening Polyps (handout given) Diverticulosis (handout given) High Fiber Diet (handout given)   SIGNATURES/CONFIDENTIALITY: You and/or your care partner have signed paperwork which will be entered into your electronic medical record.  These signatures attest to the fact that that the information above on your After Visit Summary has been reviewed and is understood.  Full responsibility of the confidentiality of this discharge information lies with you and/or your care-partner.

## 2017-06-23 ENCOUNTER — Telehealth: Payer: Self-pay | Admitting: *Deleted

## 2017-06-23 NOTE — Telephone Encounter (Signed)
No answer, left message to call if questions or concerns. 

## 2017-06-23 NOTE — Telephone Encounter (Signed)
  Follow up Call-  Call back number 06/22/2017  Post procedure Call Back phone  # [phone]  Permission to leave phone message Yes  Some recent data might be hidden     Patient questions:  Do you have a fever, pain , or abdominal swelling? No. Pain Score  0 *  Have you tolerated food without any problems? Yes.    Have you been able to return to your normal activities? Yes.    Do you have any questions about your discharge instructions: Diet   No. Medications  No. Follow up visit  No.  Do you have questions or concerns about your Care? No.  Actions: * If pain score is 4 or above: No action needed, pain <4.

## 2017-07-15 ENCOUNTER — Encounter: Payer: Self-pay | Admitting: Gastroenterology

## 2017-07-19 NOTE — Progress Notes (Signed)
Entered in error. maw 

## 2017-12-06 ENCOUNTER — Other Ambulatory Visit: Payer: Self-pay | Admitting: Obstetrics and Gynecology

## 2017-12-06 DIAGNOSIS — Z1231 Encounter for screening mammogram for malignant neoplasm of breast: Secondary | ICD-10-CM

## 2017-12-30 ENCOUNTER — Ambulatory Visit
Admission: RE | Admit: 2017-12-30 | Discharge: 2017-12-30 | Disposition: A | Discharge status: Home / Self Care | Payer: 59 | Source: Ambulatory Visit | Attending: Obstetrics and Gynecology | Admitting: Obstetrics and Gynecology

## 2017-12-30 DIAGNOSIS — Z1231 Encounter for screening mammogram for malignant neoplasm of breast: Secondary | ICD-10-CM

## 2019-04-27 ENCOUNTER — Other Ambulatory Visit: Payer: Self-pay | Admitting: Obstetrics and Gynecology

## 2019-04-27 DIAGNOSIS — Z1231 Encounter for screening mammogram for malignant neoplasm of breast: Secondary | ICD-10-CM

## 2019-05-02 ENCOUNTER — Ambulatory Visit: Payer: 59

## 2019-05-09 ENCOUNTER — Ambulatory Visit
Admission: RE | Admit: 2019-05-09 | Discharge: 2019-05-09 | Disposition: A | Discharge status: Home / Self Care | Payer: Managed Care, Other (non HMO) | Source: Ambulatory Visit | Attending: Obstetrics and Gynecology | Admitting: Obstetrics and Gynecology

## 2019-05-09 ENCOUNTER — Other Ambulatory Visit: Payer: Self-pay

## 2019-05-09 DIAGNOSIS — Z1231 Encounter for screening mammogram for malignant neoplasm of breast: Secondary | ICD-10-CM

## 2019-05-11 ENCOUNTER — Other Ambulatory Visit: Payer: Self-pay | Admitting: Obstetrics and Gynecology

## 2019-05-11 DIAGNOSIS — R928 Other abnormal and inconclusive findings on diagnostic imaging of breast: Secondary | ICD-10-CM

## 2019-05-17 ENCOUNTER — Ambulatory Visit
Admission: RE | Admit: 2019-05-17 | Discharge: 2019-05-17 | Disposition: A | Discharge status: Home / Self Care | Payer: Managed Care, Other (non HMO) | Source: Ambulatory Visit | Attending: Obstetrics and Gynecology | Admitting: Obstetrics and Gynecology

## 2019-05-17 ENCOUNTER — Other Ambulatory Visit: Payer: Self-pay

## 2019-05-17 ENCOUNTER — Ambulatory Visit: Payer: Managed Care, Other (non HMO)

## 2019-05-17 DIAGNOSIS — R928 Other abnormal and inconclusive findings on diagnostic imaging of breast: Secondary | ICD-10-CM

## 2019-06-27 ENCOUNTER — Ambulatory Visit: Payer: Managed Care, Other (non HMO) | Attending: Internal Medicine

## 2019-06-27 DIAGNOSIS — Z20822 Contact with and (suspected) exposure to covid-19: Secondary | ICD-10-CM

## 2019-06-28 LAB — NOVEL CORONAVIRUS, NAA: SARS-CoV-2, NAA: NOT DETECTED

## 2019-08-25 ENCOUNTER — Ambulatory Visit: Payer: 59 | Attending: Internal Medicine

## 2019-08-25 DIAGNOSIS — Z23 Encounter for immunization: Secondary | ICD-10-CM

## 2019-08-25 NOTE — Progress Notes (Signed)
   Covid-19 Vaccination Clinic  Name:  Kim Holland    MRN: [id number] DOB: 26-Jun-1960  08/25/2019  [name] was observed post Covid-19 immunization for 30 minutes based on pre-vaccination screening without incident. She was provided with Vaccine Information Sheet and instruction to access the V-Safe system.   [name] was instructed to call 911 with any severe reactions post vaccine: Marland Kitchen Difficulty breathing  . Swelling of face and throat  . A fast heartbeat  . A bad rash all over body  . Dizziness and weakness   Immunizations Administered    Name Date Dose VIS Date Route   Pfizer COVID-19 Vaccine 08/25/2019 11:39 AM 0.3 mL 05/05/2019 Intramuscular   Manufacturer: [place]   Lot: [id number]   [place]: [id number]

## 2019-09-18 ENCOUNTER — Ambulatory Visit: Payer: 59 | Attending: Internal Medicine

## 2019-09-18 DIAGNOSIS — Z23 Encounter for immunization: Secondary | ICD-10-CM

## 2019-09-18 NOTE — Progress Notes (Signed)
   Covid-19 Vaccination Clinic  Name:  Kim Holland    MRN: [id number] DOB: 06/02/1960  09/18/2019  [name] was observed post Covid-19 immunization for 30 minutes based on pre-vaccination screening without incident. She was provided with Vaccine Information Sheet and instruction to access the V-Safe system.   [name] was instructed to call 911 with any severe reactions post vaccine: Marland Kitchen Difficulty breathing  . Swelling of face and throat  . A fast heartbeat  . A bad rash all over body  . Dizziness and weakness   Immunizations Administered    Name Date Dose VIS Date Route   Pfizer COVID-19 Vaccine 09/18/2019  1:01 PM 0.3 mL 07/19/2018 Intramuscular   Manufacturer: [place]   Lot: [id number]   [place]: [id number]

## 2020-04-03 ENCOUNTER — Other Ambulatory Visit: Payer: Self-pay | Admitting: Obstetrics and Gynecology

## 2020-04-03 DIAGNOSIS — Z1231 Encounter for screening mammogram for malignant neoplasm of breast: Secondary | ICD-10-CM

## 2020-04-17 DIAGNOSIS — F411 Generalized anxiety disorder: Secondary | ICD-10-CM | POA: Diagnosis not present

## 2020-04-17 DIAGNOSIS — F1010 Alcohol abuse, uncomplicated: Secondary | ICD-10-CM | POA: Diagnosis not present

## 2020-04-17 DIAGNOSIS — F1410 Cocaine abuse, uncomplicated: Secondary | ICD-10-CM | POA: Diagnosis not present

## 2020-04-17 DIAGNOSIS — F320 Major depressive disorder, single episode, mild: Secondary | ICD-10-CM | POA: Diagnosis not present

## 2020-04-24 DIAGNOSIS — F411 Generalized anxiety disorder: Secondary | ICD-10-CM | POA: Diagnosis not present

## 2020-04-24 DIAGNOSIS — F329 Major depressive disorder, single episode, unspecified: Secondary | ICD-10-CM | POA: Diagnosis not present

## 2020-04-24 DIAGNOSIS — F4325 Adjustment disorder with mixed disturbance of emotions and conduct: Secondary | ICD-10-CM | POA: Diagnosis not present

## 2020-04-24 DIAGNOSIS — F1010 Alcohol abuse, uncomplicated: Secondary | ICD-10-CM | POA: Diagnosis not present

## 2020-05-21 ENCOUNTER — Other Ambulatory Visit: Payer: Self-pay

## 2020-05-21 ENCOUNTER — Ambulatory Visit
Admission: RE | Admit: 2020-05-21 | Discharge: 2020-05-21 | Disposition: A | Discharge status: Home / Self Care | Payer: BC Managed Care – PPO | Source: Ambulatory Visit | Attending: Obstetrics and Gynecology | Admitting: Obstetrics and Gynecology

## 2020-05-21 DIAGNOSIS — Z1231 Encounter for screening mammogram for malignant neoplasm of breast: Secondary | ICD-10-CM

## 2020-05-28 DIAGNOSIS — Z01419 Encounter for gynecological examination (general) (routine) without abnormal findings: Secondary | ICD-10-CM | POA: Diagnosis not present

## 2020-06-13 DIAGNOSIS — F4321 Adjustment disorder with depressed mood: Secondary | ICD-10-CM | POA: Diagnosis not present

## 2020-06-13 DIAGNOSIS — F900 Attention-deficit hyperactivity disorder, predominantly inattentive type: Secondary | ICD-10-CM | POA: Diagnosis not present

## 2020-06-13 DIAGNOSIS — F411 Generalized anxiety disorder: Secondary | ICD-10-CM | POA: Diagnosis not present

## 2020-07-16 DIAGNOSIS — F1210 Cannabis abuse, uncomplicated: Secondary | ICD-10-CM | POA: Diagnosis not present

## 2020-07-16 DIAGNOSIS — F4325 Adjustment disorder with mixed disturbance of emotions and conduct: Secondary | ICD-10-CM | POA: Diagnosis not present

## 2020-07-16 DIAGNOSIS — F411 Generalized anxiety disorder: Secondary | ICD-10-CM | POA: Diagnosis not present

## 2020-07-16 DIAGNOSIS — F329 Major depressive disorder, single episode, unspecified: Secondary | ICD-10-CM | POA: Diagnosis not present

## 2020-08-05 DIAGNOSIS — F411 Generalized anxiety disorder: Secondary | ICD-10-CM | POA: Diagnosis not present

## 2020-08-05 DIAGNOSIS — F329 Major depressive disorder, single episode, unspecified: Secondary | ICD-10-CM | POA: Diagnosis not present

## 2020-08-05 DIAGNOSIS — F4325 Adjustment disorder with mixed disturbance of emotions and conduct: Secondary | ICD-10-CM | POA: Diagnosis not present

## 2020-08-05 DIAGNOSIS — F1210 Cannabis abuse, uncomplicated: Secondary | ICD-10-CM | POA: Diagnosis not present

## 2020-08-20 DIAGNOSIS — L718 Other rosacea: Secondary | ICD-10-CM | POA: Diagnosis not present

## 2020-11-06 DIAGNOSIS — E875 Hyperkalemia: Secondary | ICD-10-CM | POA: Diagnosis not present

## 2020-11-13 DIAGNOSIS — Z0000 Encounter for general adult medical examination without abnormal findings: Secondary | ICD-10-CM | POA: Diagnosis not present

## 2020-11-13 DIAGNOSIS — R748 Abnormal levels of other serum enzymes: Secondary | ICD-10-CM | POA: Diagnosis not present

## 2020-11-13 DIAGNOSIS — E785 Hyperlipidemia, unspecified: Secondary | ICD-10-CM | POA: Diagnosis not present

## 2020-11-13 DIAGNOSIS — K760 Fatty (change of) liver, not elsewhere classified: Secondary | ICD-10-CM | POA: Diagnosis not present

## 2021-02-19 DIAGNOSIS — H1045 Other chronic allergic conjunctivitis: Secondary | ICD-10-CM | POA: Diagnosis not present

## 2021-02-19 DIAGNOSIS — L509 Urticaria, unspecified: Secondary | ICD-10-CM | POA: Diagnosis not present

## 2021-02-19 DIAGNOSIS — T783XXD Angioneurotic edema, subsequent encounter: Secondary | ICD-10-CM | POA: Diagnosis not present

## 2021-04-02 DIAGNOSIS — L718 Other rosacea: Secondary | ICD-10-CM | POA: Diagnosis not present

## 2021-04-02 DIAGNOSIS — L244 Irritant contact dermatitis due to drugs in contact with skin: Secondary | ICD-10-CM | POA: Diagnosis not present

## 2021-04-02 DIAGNOSIS — L820 Inflamed seborrheic keratosis: Secondary | ICD-10-CM | POA: Diagnosis not present

## 2021-07-09 DIAGNOSIS — N390 Urinary tract infection, site not specified: Secondary | ICD-10-CM | POA: Diagnosis not present

## 2021-07-09 DIAGNOSIS — R300 Dysuria: Secondary | ICD-10-CM | POA: Diagnosis not present

## 2021-08-26 DIAGNOSIS — F411 Generalized anxiety disorder: Secondary | ICD-10-CM | POA: Diagnosis not present

## 2021-09-01 DIAGNOSIS — F411 Generalized anxiety disorder: Secondary | ICD-10-CM | POA: Diagnosis not present

## 2021-09-08 DIAGNOSIS — F411 Generalized anxiety disorder: Secondary | ICD-10-CM | POA: Diagnosis not present

## 2021-09-16 DIAGNOSIS — F411 Generalized anxiety disorder: Secondary | ICD-10-CM | POA: Diagnosis not present

## 2021-10-06 DIAGNOSIS — F411 Generalized anxiety disorder: Secondary | ICD-10-CM | POA: Diagnosis not present

## 2021-11-03 DIAGNOSIS — F411 Generalized anxiety disorder: Secondary | ICD-10-CM | POA: Diagnosis not present

## 2021-11-17 DIAGNOSIS — L718 Other rosacea: Secondary | ICD-10-CM | POA: Diagnosis not present

## 2021-11-18 DIAGNOSIS — F411 Generalized anxiety disorder: Secondary | ICD-10-CM | POA: Diagnosis not present

## 2021-11-27 ENCOUNTER — Other Ambulatory Visit: Payer: Self-pay | Admitting: Obstetrics and Gynecology

## 2021-11-27 DIAGNOSIS — Z1231 Encounter for screening mammogram for malignant neoplasm of breast: Secondary | ICD-10-CM

## 2021-11-28 ENCOUNTER — Ambulatory Visit
Admission: RE | Admit: 2021-11-28 | Discharge: 2021-11-28 | Disposition: A | Discharge status: Home / Self Care | Payer: BC Managed Care – PPO | Source: Ambulatory Visit | Attending: Obstetrics and Gynecology | Admitting: Obstetrics and Gynecology

## 2021-11-28 DIAGNOSIS — E538 Deficiency of other specified B group vitamins: Secondary | ICD-10-CM | POA: Diagnosis not present

## 2021-11-28 DIAGNOSIS — E785 Hyperlipidemia, unspecified: Secondary | ICD-10-CM | POA: Diagnosis not present

## 2021-11-28 DIAGNOSIS — E039 Hypothyroidism, unspecified: Secondary | ICD-10-CM | POA: Diagnosis not present

## 2021-11-28 DIAGNOSIS — R300 Dysuria: Secondary | ICD-10-CM | POA: Diagnosis not present

## 2021-11-28 DIAGNOSIS — Z0000 Encounter for general adult medical examination without abnormal findings: Secondary | ICD-10-CM | POA: Diagnosis not present

## 2021-11-28 DIAGNOSIS — Z1231 Encounter for screening mammogram for malignant neoplasm of breast: Secondary | ICD-10-CM | POA: Diagnosis not present

## 2021-12-04 ENCOUNTER — Other Ambulatory Visit: Payer: Self-pay | Admitting: Registered Nurse

## 2021-12-04 DIAGNOSIS — M8589 Other specified disorders of bone density and structure, multiple sites: Secondary | ICD-10-CM | POA: Diagnosis not present

## 2021-12-04 DIAGNOSIS — E785 Hyperlipidemia, unspecified: Secondary | ICD-10-CM

## 2021-12-04 DIAGNOSIS — Z780 Asymptomatic menopausal state: Secondary | ICD-10-CM | POA: Diagnosis not present

## 2021-12-04 DIAGNOSIS — R748 Abnormal levels of other serum enzymes: Secondary | ICD-10-CM | POA: Diagnosis not present

## 2021-12-04 DIAGNOSIS — I10 Essential (primary) hypertension: Secondary | ICD-10-CM | POA: Diagnosis not present

## 2021-12-04 DIAGNOSIS — Z0000 Encounter for general adult medical examination without abnormal findings: Secondary | ICD-10-CM | POA: Diagnosis not present

## 2021-12-09 DIAGNOSIS — F418 Other specified anxiety disorders: Secondary | ICD-10-CM | POA: Diagnosis not present

## 2021-12-22 DIAGNOSIS — F411 Generalized anxiety disorder: Secondary | ICD-10-CM | POA: Diagnosis not present

## 2021-12-23 ENCOUNTER — Other Ambulatory Visit: Payer: Self-pay | Admitting: Registered Nurse

## 2021-12-23 DIAGNOSIS — R7989 Other specified abnormal findings of blood chemistry: Secondary | ICD-10-CM

## 2021-12-25 ENCOUNTER — Ambulatory Visit
Admission: RE | Admit: 2021-12-25 | Discharge: 2021-12-25 | Disposition: A | Discharge status: Home / Self Care | Payer: BC Managed Care – PPO | Source: Ambulatory Visit | Attending: Registered Nurse | Admitting: Registered Nurse

## 2021-12-25 DIAGNOSIS — K8020 Calculus of gallbladder without cholecystitis without obstruction: Secondary | ICD-10-CM | POA: Diagnosis not present

## 2021-12-25 DIAGNOSIS — R7989 Other specified abnormal findings of blood chemistry: Secondary | ICD-10-CM

## 2021-12-25 DIAGNOSIS — R945 Abnormal results of liver function studies: Secondary | ICD-10-CM | POA: Diagnosis not present

## 2021-12-30 DIAGNOSIS — K760 Fatty (change of) liver, not elsewhere classified: Secondary | ICD-10-CM | POA: Diagnosis not present

## 2021-12-30 DIAGNOSIS — F418 Other specified anxiety disorders: Secondary | ICD-10-CM | POA: Diagnosis not present

## 2021-12-30 DIAGNOSIS — L718 Other rosacea: Secondary | ICD-10-CM | POA: Diagnosis not present

## 2021-12-30 DIAGNOSIS — L814 Other melanin hyperpigmentation: Secondary | ICD-10-CM | POA: Diagnosis not present

## 2022-01-13 DIAGNOSIS — F418 Other specified anxiety disorders: Secondary | ICD-10-CM | POA: Diagnosis not present

## 2022-01-13 DIAGNOSIS — R748 Abnormal levels of other serum enzymes: Secondary | ICD-10-CM | POA: Diagnosis not present

## 2022-01-13 DIAGNOSIS — K760 Fatty (change of) liver, not elsewhere classified: Secondary | ICD-10-CM | POA: Diagnosis not present

## 2022-01-13 DIAGNOSIS — I10 Essential (primary) hypertension: Secondary | ICD-10-CM | POA: Diagnosis not present

## 2022-01-21 DIAGNOSIS — R748 Abnormal levels of other serum enzymes: Secondary | ICD-10-CM | POA: Diagnosis not present

## 2022-02-12 ENCOUNTER — Other Ambulatory Visit: Payer: BC Managed Care – PPO

## 2022-02-16 DIAGNOSIS — F411 Generalized anxiety disorder: Secondary | ICD-10-CM | POA: Diagnosis not present

## 2022-03-04 ENCOUNTER — Inpatient Hospital Stay: Admission: RE | Admit: 2022-03-04 | Payer: BC Managed Care – PPO | Source: Ambulatory Visit

## 2022-04-14 DIAGNOSIS — H1045 Other chronic allergic conjunctivitis: Secondary | ICD-10-CM | POA: Diagnosis not present

## 2022-04-14 DIAGNOSIS — L509 Urticaria, unspecified: Secondary | ICD-10-CM | POA: Diagnosis not present

## 2022-06-10 ENCOUNTER — Encounter: Payer: Self-pay | Admitting: Gastroenterology

## 2022-06-15 ENCOUNTER — Encounter: Payer: Self-pay | Admitting: Physician Assistant

## 2022-06-17 ENCOUNTER — Inpatient Hospital Stay (HOSPITAL_COMMUNITY)
Admission: EM | Admit: 2022-06-17 | Discharge: 2022-06-24 | DRG: 432 | Disposition: A | Discharge status: Home / Self Care | Payer: No Typology Code available for payment source | Attending: Internal Medicine | Admitting: Internal Medicine

## 2022-06-17 ENCOUNTER — Emergency Department (HOSPITAL_COMMUNITY): Payer: No Typology Code available for payment source

## 2022-06-17 ENCOUNTER — Encounter (HOSPITAL_COMMUNITY): Payer: Self-pay | Admitting: Emergency Medicine

## 2022-06-17 ENCOUNTER — Other Ambulatory Visit: Payer: Self-pay

## 2022-06-17 ENCOUNTER — Inpatient Hospital Stay (HOSPITAL_COMMUNITY): Payer: No Typology Code available for payment source

## 2022-06-17 DIAGNOSIS — R569 Unspecified convulsions: Secondary | ICD-10-CM

## 2022-06-17 DIAGNOSIS — J189 Pneumonia, unspecified organism: Secondary | ICD-10-CM | POA: Diagnosis present

## 2022-06-17 DIAGNOSIS — K219 Gastro-esophageal reflux disease without esophagitis: Secondary | ICD-10-CM | POA: Diagnosis not present

## 2022-06-17 DIAGNOSIS — D72829 Elevated white blood cell count, unspecified: Secondary | ICD-10-CM | POA: Diagnosis present

## 2022-06-17 DIAGNOSIS — Z91040 Latex allergy status: Secondary | ICD-10-CM

## 2022-06-17 DIAGNOSIS — K7200 Acute and subacute hepatic failure without coma: Secondary | ICD-10-CM | POA: Diagnosis not present

## 2022-06-17 DIAGNOSIS — F419 Anxiety disorder, unspecified: Secondary | ICD-10-CM | POA: Diagnosis present

## 2022-06-17 DIAGNOSIS — E876 Hypokalemia: Secondary | ICD-10-CM | POA: Diagnosis present

## 2022-06-17 DIAGNOSIS — Z79899 Other long term (current) drug therapy: Secondary | ICD-10-CM

## 2022-06-17 DIAGNOSIS — K7290 Hepatic failure, unspecified without coma: Secondary | ICD-10-CM

## 2022-06-17 DIAGNOSIS — E785 Hyperlipidemia, unspecified: Secondary | ICD-10-CM | POA: Diagnosis present

## 2022-06-17 DIAGNOSIS — F1010 Alcohol abuse, uncomplicated: Secondary | ICD-10-CM | POA: Diagnosis not present

## 2022-06-17 DIAGNOSIS — E861 Hypovolemia: Secondary | ICD-10-CM | POA: Diagnosis present

## 2022-06-17 DIAGNOSIS — Z7982 Long term (current) use of aspirin: Secondary | ICD-10-CM

## 2022-06-17 DIAGNOSIS — Z1152 Encounter for screening for COVID-19: Secondary | ICD-10-CM | POA: Diagnosis not present

## 2022-06-17 DIAGNOSIS — K7040 Alcoholic hepatic failure without coma: Principal | ICD-10-CM | POA: Diagnosis present

## 2022-06-17 DIAGNOSIS — N179 Acute kidney failure, unspecified: Secondary | ICD-10-CM | POA: Diagnosis present

## 2022-06-17 DIAGNOSIS — K7682 Hepatic encephalopathy: Secondary | ICD-10-CM | POA: Diagnosis present

## 2022-06-17 DIAGNOSIS — D696 Thrombocytopenia, unspecified: Secondary | ICD-10-CM | POA: Diagnosis present

## 2022-06-17 DIAGNOSIS — K8020 Calculus of gallbladder without cholecystitis without obstruction: Secondary | ICD-10-CM | POA: Diagnosis present

## 2022-06-17 DIAGNOSIS — I700 Atherosclerosis of aorta: Secondary | ICD-10-CM | POA: Diagnosis present

## 2022-06-17 DIAGNOSIS — K7011 Alcoholic hepatitis with ascites: Secondary | ICD-10-CM | POA: Diagnosis not present

## 2022-06-17 DIAGNOSIS — Z8249 Family history of ischemic heart disease and other diseases of the circulatory system: Secondary | ICD-10-CM

## 2022-06-17 DIAGNOSIS — I10 Essential (primary) hypertension: Secondary | ICD-10-CM | POA: Diagnosis present

## 2022-06-17 DIAGNOSIS — K7031 Alcoholic cirrhosis of liver with ascites: Secondary | ICD-10-CM

## 2022-06-17 DIAGNOSIS — K760 Fatty (change of) liver, not elsewhere classified: Secondary | ICD-10-CM | POA: Diagnosis present

## 2022-06-17 DIAGNOSIS — E871 Hypo-osmolality and hyponatremia: Secondary | ICD-10-CM | POA: Diagnosis present

## 2022-06-17 DIAGNOSIS — Z87891 Personal history of nicotine dependence: Secondary | ICD-10-CM

## 2022-06-17 DIAGNOSIS — N390 Urinary tract infection, site not specified: Secondary | ICD-10-CM | POA: Diagnosis present

## 2022-06-17 DIAGNOSIS — R7989 Other specified abnormal findings of blood chemistry: Secondary | ICD-10-CM | POA: Diagnosis not present

## 2022-06-17 DIAGNOSIS — R4182 Altered mental status, unspecified: Secondary | ICD-10-CM | POA: Diagnosis present

## 2022-06-17 DIAGNOSIS — Z880 Allergy status to penicillin: Secondary | ICD-10-CM

## 2022-06-17 DIAGNOSIS — F1020 Alcohol dependence, uncomplicated: Secondary | ICD-10-CM | POA: Diagnosis present

## 2022-06-17 DIAGNOSIS — G40909 Epilepsy, unspecified, not intractable, without status epilepticus: Secondary | ICD-10-CM | POA: Diagnosis present

## 2022-06-17 DIAGNOSIS — K7460 Unspecified cirrhosis of liver: Secondary | ICD-10-CM | POA: Diagnosis not present

## 2022-06-17 DIAGNOSIS — R197 Diarrhea, unspecified: Secondary | ICD-10-CM | POA: Diagnosis not present

## 2022-06-17 DIAGNOSIS — D689 Coagulation defect, unspecified: Secondary | ICD-10-CM | POA: Diagnosis present

## 2022-06-17 DIAGNOSIS — R278 Other lack of coordination: Secondary | ICD-10-CM | POA: Diagnosis not present

## 2022-06-17 DIAGNOSIS — I2489 Other forms of acute ischemic heart disease: Secondary | ICD-10-CM | POA: Diagnosis present

## 2022-06-17 DIAGNOSIS — Z881 Allergy status to other antibiotic agents status: Secondary | ICD-10-CM

## 2022-06-17 DIAGNOSIS — I868 Varicose veins of other specified sites: Secondary | ICD-10-CM | POA: Diagnosis present

## 2022-06-17 HISTORY — DX: Hepatic failure, unspecified without coma: K72.90

## 2022-06-17 LAB — CBC
HCT: 40.4 % (ref 36.0–46.0)
Hemoglobin: 14.9 g/dL (ref 12.0–15.0)
MCH: 34.4 pg — ABNORMAL HIGH (ref 26.0–34.0)
MCHC: 36.9 g/dL — ABNORMAL HIGH (ref 30.0–36.0)
MCV: 93.3 fL (ref 80.0–100.0)
Platelets: 171 10*3/uL (ref 150–400)
RBC: 4.33 MIL/uL (ref 3.87–5.11)
RDW: 13.8 % (ref 11.5–15.5)
WBC: 18 10*3/uL — ABNORMAL HIGH (ref 4.0–10.5)
nRBC: 0.1 % (ref 0.0–0.2)

## 2022-06-17 LAB — COMPREHENSIVE METABOLIC PANEL
ALT: 48 U/L — ABNORMAL HIGH (ref 0–44)
AST: 185 U/L — ABNORMAL HIGH (ref 15–41)
Albumin: 1.9 g/dL — ABNORMAL LOW (ref 3.5–5.0)
Alkaline Phosphatase: 190 U/L — ABNORMAL HIGH (ref 38–126)
Anion gap: 17 — ABNORMAL HIGH (ref 5–15)
BUN: 22 mg/dL — ABNORMAL HIGH (ref 6–20)
CO2: 34 mmol/L — ABNORMAL HIGH (ref 22–32)
Calcium: 7.8 mg/dL — ABNORMAL LOW (ref 8.9–10.3)
Chloride: 76 mmol/L — ABNORMAL LOW (ref 98–111)
Creatinine, Ser: 1.25 mg/dL — ABNORMAL HIGH (ref 0.44–1.00)
GFR, Estimated: 49 mL/min — ABNORMAL LOW (ref >60)
Glucose, Bld: 104 mg/dL — ABNORMAL HIGH (ref 70–99)
Potassium: 2 mmol/L — CL (ref 3.5–5.1)
Sodium: 127 mmol/L — ABNORMAL LOW (ref 135–145)
Total Bilirubin: 12.7 mg/dL — ABNORMAL HIGH (ref 0.3–1.2)
Total Protein: 6 g/dL — ABNORMAL LOW (ref 6.5–8.1)

## 2022-06-17 LAB — CBG MONITORING, ED: Glucose-Capillary: 120 mg/dL — ABNORMAL HIGH (ref 70–99)

## 2022-06-17 LAB — TROPONIN I (HIGH SENSITIVITY): Troponin I (High Sensitivity): 303 ng/L (ref <18)

## 2022-06-17 LAB — LIPASE, BLOOD: Lipase: 55 U/L — ABNORMAL HIGH (ref 11–51)

## 2022-06-17 LAB — PROTIME-INR
INR: 1.7 — ABNORMAL HIGH (ref 0.8–1.2)
Prothrombin Time: 19.4 seconds — ABNORMAL HIGH (ref 11.4–15.2)

## 2022-06-17 LAB — ACETAMINOPHEN LEVEL: Acetaminophen (Tylenol), Serum: <10 ug/mL — ABNORMAL LOW (ref 10–30)

## 2022-06-17 LAB — MAGNESIUM: Magnesium: 2.6 mg/dL — ABNORMAL HIGH (ref 1.7–2.4)

## 2022-06-17 LAB — ETHANOL: Alcohol, Ethyl (B): <10 mg/dL (ref <10)

## 2022-06-17 LAB — AMMONIA: Ammonia: 57 umol/L — ABNORMAL HIGH (ref 9–35)

## 2022-06-17 LAB — SALICYLATE LEVEL: Salicylate Lvl: 8.7 mg/dL (ref 7.0–30.0)

## 2022-06-17 MED ORDER — POTASSIUM CHLORIDE 10 MEQ/100ML IV SOLN: 10.0000 meq | Freq: Once | INTRAVENOUS | Status: DC

## 2022-06-17 MED ORDER — POTASSIUM CHLORIDE CRYS ER 20 MEQ PO TBCR
60.0000 meq | EXTENDED_RELEASE_TABLET | Freq: Once | ORAL | Status: AC
  Administered 2022-06-17: 60 meq via ORAL
  Filled 2022-06-17: qty 3

## 2022-06-17 MED ORDER — POTASSIUM CHLORIDE 10 MEQ/100ML IV SOLN
10.0000 meq | INTRAVENOUS | Status: AC
  Administered 2022-06-17 – 2022-06-18 (×4): 10 meq via INTRAVENOUS
  Filled 2022-06-17 (×4): qty 100

## 2022-06-17 MED ORDER — IOHEXOL 350 MG/ML SOLN
65.0000 mL | Freq: Once | INTRAVENOUS | Status: AC | PRN
  Administered 2022-06-17: 65 mL via INTRAVENOUS

## 2022-06-17 NOTE — ED Provider Notes (Signed)
[facility] Provider Note   CSN: [id number] Arrival date & time: 06/17/22  1559     History {Add pertinent medical, surgical, social history, OB history to HPI:1} Chief Complaint  Patient presents with   Altered Mental Status    Kim Holland is a 62 y.o. female.  Patient with history of seizures, hypertension, hyperlipidemia, alcohol abuse presents today with complaints of altered mental status.  Patient states that her symptoms began around the first of the year with jaundice and nausea. She states that same has persistently worsened over the past 3-4 days with confusion that is not baseline for her. She denies any history of similar symptoms previously.  She states that she has been told that she had a fatty liver previously.  Does state that for the past year she has had daily alcohol abuse with 3-4 bottles of alcohol per day. States that she stopped drinking on January 2nd and has not had any alcohol since then.  She denies any fevers or chills, no abdominal pain.  The history is provided by the patient. No language interpreter was used.  Altered Mental Status      Home Medications Prior to Admission medications   Medication Sig Start Date End Date Taking? Authorizing Provider  ALPRAZolam Duanne Moron) 0.25 MG tablet Take as directed morning of procedure 06/17/17   Ladene Artist, MD  amLODipine (NORVASC) 5 MG tablet Take 5 mg by mouth daily.    [provider]  aspirin 81 MG tablet Take 81 mg by mouth daily.    [provider]  BIOTIN FORTE PO Take 1 tablet by mouth daily.    [provider]  EPINEPHrine (EPIPEN 2-PAK) 0.3 mg/0.3 mL IJ SOAJ injection EpiPen 2-Pak 0.3 mg/0.3 mL injection, auto-injector  use as directed by prescriber    [provider]  glucosamine-chondroitin 500-400 MG tablet Take 1 tablet by mouth 3 (three) times daily.    [provider]  hydrochlorothiazide (MICROZIDE)  12.5 MG capsule Take 12.5 mg by mouth daily.    [provider]  hydrOXYzine (ATARAX/VISTARIL) 25 MG tablet Take 25 mg by mouth 3 (three) times daily.     [provider]  MELATONIN ER PO Take 1 capsule by mouth at bedtime.    [provider]  Multiple Vitamin (MULTIVITAMIN) tablet Take 1 tablet by mouth daily.    [provider]  pravastatin (PRAVACHOL) 10 MG tablet pravastatin 10 mg tablet  1 TABLET ONCE A DAY ORALLY 90 DAYS    [provider]  ranitidine (ZANTAC) 150 MG tablet Take 150 mg by mouth 2 (two) times daily.    [provider]      Allergies    Latex, Doxycycline, Penicillin g sodium, and Ampicillin    Review of Systems   Review of Systems  All other systems reviewed and are negative.   Physical Exam Updated Vital Signs BP 115/63   Pulse 88   Temp 98.5 F (36.9 C) (Oral)   Resp (!) 27   Ht '5\' 5"'$  (1.651 m)   Wt 72.6 kg   SpO2 95%   BMI 26.63 kg/m  Physical Exam Vitals and nursing note reviewed.  Constitutional:      General: She is not in acute distress.    Appearance: Normal appearance. She is normal weight. She is not ill-appearing, toxic-appearing or diaphoretic.  HENT:     Head: Normocephalic and atraumatic.  Eyes:     General:  Scleral icterus present.  Cardiovascular:     Rate and Rhythm: Normal rate and regular rhythm.     Heart sounds: Normal heart sounds.  Pulmonary:     Effort: Pulmonary effort is normal. No respiratory distress.     Breath sounds: Normal breath sounds.  Abdominal:     General: Abdomen is flat.     Palpations: Abdomen is soft.     Tenderness: There is no abdominal tenderness.  Musculoskeletal:        General: Normal range of motion.     Cervical back: Normal range of motion.  Skin:    General: Skin is warm and dry.     Coloration: Skin is jaundiced.  Neurological:     General: No focal deficit present.     Mental Status: She is alert and oriented to person, place, and  time.  Psychiatric:        Mood and Affect: Mood normal.        Behavior: Behavior normal.     ED Results / Procedures / Treatments   Labs (all labs ordered are listed, but only abnormal results are displayed) Labs Reviewed  COMPREHENSIVE METABOLIC PANEL - Abnormal; Notable for the following components:      Result Value   Sodium 127 (*)    Potassium 2.0 (*)    Chloride 76 (*)    CO2 34 (*)    Glucose, Bld 104 (*)    BUN 22 (*)    Creatinine, Ser 1.25 (*)    Calcium 7.8 (*)    Total Protein 6.0 (*)    Albumin 1.9 (*)    AST 185 (*)    ALT 48 (*)    Alkaline Phosphatase 190 (*)    Total Bilirubin 12.7 (*)    GFR, Estimated 49 (*)    Anion gap 17 (*)    All other components within normal limits  CBC - Abnormal; Notable for the following components:   WBC 18.0 (*)    MCH 34.4 (*)    MCHC 36.9 (*)    All other components within normal limits  AMMONIA - Abnormal; Notable for the following components:   Ammonia 57 (*)    All other components within normal limits  ACETAMINOPHEN LEVEL - Abnormal; Notable for the following components:   Acetaminophen (Tylenol), Serum <10 (*)    All other components within normal limits  PROTIME-INR - Abnormal; Notable for the following components:   Prothrombin Time 19.4 (*)    INR 1.7 (*)    All other components within normal limits  CBG MONITORING, ED - Abnormal; Notable for the following components:   Glucose-Capillary 120 (*)    All other components within normal limits  ETHANOL  SALICYLATE LEVEL  MAGNESIUM  LIPASE, BLOOD  TROPONIN I (HIGH SENSITIVITY)  TROPONIN I (HIGH SENSITIVITY)    EKG EKG Interpretation  Date/Time:  Wednesday June 17 2022 18:12:18 EST Ventricular Rate:  91 PR Interval:  168 QRS Duration: 72 QT Interval:  496 QTC Calculation: 610 R Axis:   -5 Text Interpretation: *** Critical Test Result: Long QTc Normal sinus rhythm Low voltage QRS Possible Inferior infarct , age undetermined Cannot rule out  Anteroseptal infarct , age undetermined ST & T wave abnormality, consider lateral ischemia Prolonged QT Abnormal ECG No previous ECGs available Confirmed by Lacretia Leigh ([id number]) on 06/17/2022 8:24:34 PM  Radiology CT Head Wo Contrast  Result Date: 06/17/2022 CLINICAL DATA:  Altered mental status. EXAM: CT HEAD WITHOUT CONTRAST TECHNIQUE:  Contiguous axial images were obtained from the base of the skull through the vertex without intravenous contrast. RADIATION DOSE REDUCTION: This exam was performed according to the departmental dose-optimization program which includes automated exposure control, adjustment of the mA and/or kV according to patient size and/or use of iterative reconstruction technique. COMPARISON:  None Available. FINDINGS: Brain: There is mild cerebral atrophy with widening of the extra-axial spaces and ventricular dilatation. There are areas of decreased attenuation within the white matter tracts of the supratentorial brain, consistent with microvascular disease changes. Vascular: No hyperdense vessel or unexpected calcification. Skull: Normal. Negative for fracture or focal lesion. Sinuses/Orbits: No acute finding. Other: None. IMPRESSION: 1. No acute intracranial abnormality. 2. Cerebral atrophy and microvascular disease changes of the supratentorial brain. Electronically Signed   By: Virgina Norfolk M.D.   On: 06/17/2022 19:13    Procedures .Critical Care  Performed by: Bud Face, PA-C Authorized by: Bud Face, PA-C   Critical care provider statement:    Critical care time (minutes):  35   Critical care start time:  06/17/2022 8:30 PM   Critical care end time:  06/17/2022 9:05 PM   Critical care was necessary to treat or prevent imminent or life-threatening deterioration of the following conditions:  Hepatic failure   Critical care was time spent personally by me on the following activities:  Development of treatment plan with patient or surrogate, discussions with  primary provider, evaluation of patient's response to treatment, examination of patient, obtaining history from patient or surrogate, ordering and review of laboratory studies, ordering and review of radiographic studies, pulse oximetry and re-evaluation of patient's condition   Care discussed with: admitting provider     {Document cardiac monitor, telemetry assessment procedure when appropriate:1}  Medications Ordered in ED Medications  potassium chloride 10 mEq in 100 mL IVPB (has no administration in time range)  potassium chloride SA (KLOR-CON M) CR tablet 60 mEq (has no administration in time range)    ED Course/ Medical Decision Making/ A&P   {   Click here for ABCD2, HEART and other calculatorsREFRESH Note before signing :1}                          Medical Decision Making Amount and/or Complexity of Data Reviewed Labs: ordered. Radiology: ordered.  Risk Prescription drug management.   This patient is a 62 y.o. female who presents to the ED for concern of altered mental status, this involves an extensive number of treatment options, and is a complaint that carries with it a high risk of complications and morbidity. The emergent differential diagnosis prior to evaluation includes, but is not limited to,  Drug-related, hypoxia, hyper/hypoglycemia, encephalopathy, sepsis, DKA/HHS, brain lesion, CVA, seizure, environmental, psychiatric  This is not an exhaustive differential.   Past Medical History / Co-morbidities / Social History: Hx seizures, hypertension, hyperlipidemia, alcohol abuse   Physical Exam: Physical exam performed. The pertinent findings include: Jaundice with scleral icterus. Abdomen soft and non-tender  Lab Tests: I ordered, and personally interpreted labs.  The pertinent results include:  ***   Imaging Studies: I ordered imaging studies including ***. I independently visualized and interpreted imaging which showed ***. I agree with the radiologist  interpretation.   Cardiac Monitoring:  The patient was maintained on a cardiac monitor.  My attending physician Dr. Marland Kitchen viewed and interpreted the cardiac monitored which showed an underlying rhythm of: ***. I agree with this interpretation.   Medications: I ordered  medication including ***  for ***. Reevaluation of the patient after these medicines showed that the patient {resolved/improved/worsened:23923::"improved"}. I have reviewed the patients home medicines and have made adjustments as needed.  Consultations Obtained: I requested consultation with the ***,  and discussed lab and imaging findings as well as pertinent plan - they recommend: ***   Disposition: After consideration of the diagnostic results and the patients response to treatment, I feel that *** .   ***emergency department workup does not suggest an emergent condition requiring admission or immediate intervention beyond what has been performed at this time. The plan is: ***. The patient is safe for discharge and has been instructed to return immediately for worsening symptoms, change in symptoms or any other concerns.  I discussed this case with my attending physician Dr. Marland Kitchen who cosigned this note including patient's presenting symptoms, physical exam, and planned diagnostics and interventions. Attending physician stated agreement with plan or made changes to plan which were implemented.     {Document critical care time when appropriate:1} {Document review of labs and clinical decision tools ie heart score, Chads2Vasc2 etc:1}  {Document your independent review of radiology images, and any outside records:1} {Document your discussion with family members, caretakers, and with consultants:1} {Document social determinants of health affecting pt's care:1} {Document your decision making why or why not admission, treatments were needed:1} Final Clinical Impression(s) / ED Diagnoses Final diagnoses:  None    Rx / DC  Orders ED Discharge Orders     None

## 2022-06-17 NOTE — ED Notes (Signed)
Critical result troponin 303, notified Jonelle Sidle, MD at this time.

## 2022-06-17 NOTE — ED Provider Notes (Incomplete)
[facility] Provider Note   CSN: [id number] Arrival date & time: 06/17/22  1559     History {Add pertinent medical, surgical, social history, OB history to HPI:1} Chief Complaint  Patient presents with  . Altered Mental Status    Kim Holland is a 62 y.o. female.  Patient with history of seizures, hypertension, hyperlipidemia, alcohol abuse presents today with complaints of altered mental status.  Patient states that her symptoms began around the first of the year with jaundice and nausea. She states that same has persistently worsened over the past 3-4 days with confusion that is not baseline for her. She denies any history of similar symptoms previously.  She states that she has been told that she had a fatty liver previously.  Does state that for the past year she has had daily alcohol abuse with 3-4 bottles of alcohol per day. States that she stopped drinking on January 2nd and has not had any alcohol since then.  She denies any fevers or chills, no abdominal pain.  The history is provided by the patient. No language interpreter was used.  Altered Mental Status      Home Medications Prior to Admission medications   Medication Sig Start Date End Date Taking? Authorizing Provider  ALPRAZolam Duanne Moron) 0.25 MG tablet Take as directed morning of procedure 06/17/17   Ladene Artist, MD  amLODipine (NORVASC) 5 MG tablet Take 5 mg by mouth daily.    [provider]  aspirin 81 MG tablet Take 81 mg by mouth daily.    [provider]  BIOTIN FORTE PO Take 1 tablet by mouth daily.    [provider]  EPINEPHrine (EPIPEN 2-PAK) 0.3 mg/0.3 mL IJ SOAJ injection EpiPen 2-Pak 0.3 mg/0.3 mL injection, auto-injector  use as directed by prescriber    [provider]  glucosamine-chondroitin 500-400 MG tablet Take 1 tablet by mouth 3 (three) times daily.    [provider]  hydrochlorothiazide (MICROZIDE)  12.5 MG capsule Take 12.5 mg by mouth daily.    [provider]  hydrOXYzine (ATARAX/VISTARIL) 25 MG tablet Take 25 mg by mouth 3 (three) times daily.     [provider]  MELATONIN ER PO Take 1 capsule by mouth at bedtime.    [provider]  Multiple Vitamin (MULTIVITAMIN) tablet Take 1 tablet by mouth daily.    [provider]  pravastatin (PRAVACHOL) 10 MG tablet pravastatin 10 mg tablet  1 TABLET ONCE A DAY ORALLY 90 DAYS    [provider]  ranitidine (ZANTAC) 150 MG tablet Take 150 mg by mouth 2 (two) times daily.    [provider]      Allergies    Latex, Doxycycline, Penicillin g sodium, and Ampicillin    Review of Systems   Review of Systems  All other systems reviewed and are negative.   Physical Exam Updated Vital Signs BP 115/63   Pulse 88   Temp 98.5 F (36.9 C) (Oral)   Resp (!) 27   Ht '5\' 5"'$  (1.651 m)   Wt 72.6 kg   SpO2 95%   BMI 26.63 kg/m  Physical Exam Vitals and nursing note reviewed.  Constitutional:      General: She is not in acute distress.    Appearance: Normal appearance. She is normal weight. She is not ill-appearing, toxic-appearing or diaphoretic.  HENT:     Head: Normocephalic and atraumatic.  Eyes:     General:  Scleral icterus present.  Cardiovascular:     Rate and Rhythm: Normal rate and regular rhythm.     Heart sounds: Normal heart sounds.  Pulmonary:     Effort: Pulmonary effort is normal. No respiratory distress.     Breath sounds: Normal breath sounds.  Abdominal:     General: Abdomen is flat.     Palpations: Abdomen is soft.     Tenderness: There is no abdominal tenderness.  Musculoskeletal:        General: Normal range of motion.     Cervical back: Normal range of motion.  Skin:    General: Skin is warm and dry.     Coloration: Skin is jaundiced.  Neurological:     General: No focal deficit present.     Mental Status: She is alert and oriented to person, place, and  time.  Psychiatric:        Mood and Affect: Mood normal.        Behavior: Behavior normal.     ED Results / Procedures / Treatments   Labs (all labs ordered are listed, but only abnormal results are displayed) Labs Reviewed  COMPREHENSIVE METABOLIC PANEL - Abnormal; Notable for the following components:      Result Value   Sodium 127 (*)    Potassium 2.0 (*)    Chloride 76 (*)    CO2 34 (*)    Glucose, Bld 104 (*)    BUN 22 (*)    Creatinine, Ser 1.25 (*)    Calcium 7.8 (*)    Total Protein 6.0 (*)    Albumin 1.9 (*)    AST 185 (*)    ALT 48 (*)    Alkaline Phosphatase 190 (*)    Total Bilirubin 12.7 (*)    GFR, Estimated 49 (*)    Anion gap 17 (*)    All other components within normal limits  CBC - Abnormal; Notable for the following components:   WBC 18.0 (*)    MCH 34.4 (*)    MCHC 36.9 (*)    All other components within normal limits  AMMONIA - Abnormal; Notable for the following components:   Ammonia 57 (*)    All other components within normal limits  ACETAMINOPHEN LEVEL - Abnormal; Notable for the following components:   Acetaminophen (Tylenol), Serum <10 (*)    All other components within normal limits  PROTIME-INR - Abnormal; Notable for the following components:   Prothrombin Time 19.4 (*)    INR 1.7 (*)    All other components within normal limits  CBG MONITORING, ED - Abnormal; Notable for the following components:   Glucose-Capillary 120 (*)    All other components within normal limits  ETHANOL  SALICYLATE LEVEL  MAGNESIUM  LIPASE, BLOOD  TROPONIN I (HIGH SENSITIVITY)  TROPONIN I (HIGH SENSITIVITY)    EKG EKG Interpretation  Date/Time:  Wednesday June 17 2022 18:12:18 EST Ventricular Rate:  91 PR Interval:  168 QRS Duration: 72 QT Interval:  496 QTC Calculation: 610 R Axis:   -5 Text Interpretation: *** Critical Test Result: Long QTc Normal sinus rhythm Low voltage QRS Possible Inferior infarct , age undetermined Cannot rule out  Anteroseptal infarct , age undetermined ST & T wave abnormality, consider lateral ischemia Prolonged QT Abnormal ECG No previous ECGs available Confirmed by Lacretia Leigh ([id number]) on 06/17/2022 8:24:34 PM  Radiology CT Head Wo Contrast  Result Date: 06/17/2022 CLINICAL DATA:  Altered mental status. EXAM: CT HEAD WITHOUT CONTRAST TECHNIQUE:  Contiguous axial images were obtained from the base of the skull through the vertex without intravenous contrast. RADIATION DOSE REDUCTION: This exam was performed according to the departmental dose-optimization program which includes automated exposure control, adjustment of the mA and/or kV according to patient size and/or use of iterative reconstruction technique. COMPARISON:  None Available. FINDINGS: Brain: There is mild cerebral atrophy with widening of the extra-axial spaces and ventricular dilatation. There are areas of decreased attenuation within the white matter tracts of the supratentorial brain, consistent with microvascular disease changes. Vascular: No hyperdense vessel or unexpected calcification. Skull: Normal. Negative for fracture or focal lesion. Sinuses/Orbits: No acute finding. Other: None. IMPRESSION: 1. No acute intracranial abnormality. 2. Cerebral atrophy and microvascular disease changes of the supratentorial brain. Electronically Signed   By: Virgina Norfolk M.D.   On: 06/17/2022 19:13    Procedures .Critical Care  Performed by: Bud Face, PA-C Authorized by: Bud Face, PA-C   Critical care provider statement:    Critical care time (minutes):  35   Critical care start time:  06/17/2022 8:30 PM   Critical care end time:  06/17/2022 9:05 PM   Critical care was necessary to treat or prevent imminent or life-threatening deterioration of the following conditions:  Hepatic failure   Critical care was time spent personally by me on the following activities:  Development of treatment plan with patient or surrogate, discussions with  primary provider, evaluation of patient's response to treatment, examination of patient, obtaining history from patient or surrogate, ordering and review of laboratory studies, ordering and review of radiographic studies, pulse oximetry and re-evaluation of patient's condition   Care discussed with: admitting provider     {Document cardiac monitor, telemetry assessment procedure when appropriate:1}  Medications Ordered in ED Medications  potassium chloride 10 mEq in 100 mL IVPB (has no administration in time range)  potassium chloride SA (KLOR-CON M) CR tablet 60 mEq (has no administration in time range)    ED Course/ Medical Decision Making/ A&P   {   Click here for ABCD2, HEART and other calculatorsREFRESH Note before signing :1}                          Medical Decision Making Amount and/or Complexity of Data Reviewed Labs: ordered. Radiology: ordered.  Risk Prescription drug management. Decision regarding hospitalization.   This patient is a 62 y.o. female who presents to the ED for concern of altered mental status, this involves an extensive number of treatment options, and is a complaint that carries with it a high risk of complications and morbidity. The emergent differential diagnosis prior to evaluation includes, but is not limited to,  Drug-related, hypoxia, hyper/hypoglycemia, encephalopathy, sepsis, DKA/HHS, brain lesion, CVA, seizure, environmental, psychiatric  This is not an exhaustive differential.   Past Medical History / Co-morbidities / Social History: Hx seizures, hypertension, hyperlipidemia, alcohol abuse   Physical Exam: Physical exam performed. The pertinent findings include: Jaundice with scleral icterus. Abdomen soft and non-tender  Lab Tests: I ordered, and personally interpreted labs.  The pertinent results include:  Na 127, K 2.0, chloride 76, bicarb 34, BUN 22, creatinine 1.25, calcium 7.8, protein 6.0, albumin 1.9, AST 185, ALT 48, Alk phos 190, T.  Bili 12.7, anion gap 17. WBC 18. Ammonia 57. INR 1.7.   Imaging Studies: I ordered imaging studies including CT head, CT abdomen pelvis. I independently visualized and interpreted imaging which showed   CT head: no acute  findings  CT abdomen pelvis:   1. Moderate ascites throughout the abdomen and pelvis. 2. Heterogeneous liver with possible focal lesion in the left lobe of the liver. Correlate clinically with LFTs. Recommend further evaluation with MRI. 3. Retroperitoneal varices. 4. Cholelithiasis. 5. Uterine fibroid.  I agree with the radiologist interpretation.   Cardiac Monitoring:  The patient was maintained on a cardiac monitor.  My attending physician Dr. Marland Kitchen viewed and interpreted the cardiac monitored which showed an underlying rhythm of: ***. I agree with this interpretation.   Medications: I ordered medication including ***  for ***. Reevaluation of the patient after these medicines showed that the patient {resolved/improved/worsened:23923::"improved"}. I have reviewed the patients home medicines and have made adjustments as needed.  Consultations Obtained: I requested consultation with the ***,  and discussed lab and imaging findings as well as pertinent plan - they recommend: ***   Disposition: After consideration of the diagnostic results and the patients response to treatment, I feel that *** .   ***emergency department workup does not suggest an emergent condition requiring admission or immediate intervention beyond what has been performed at this time. The plan is: ***. The patient is safe for discharge and has been instructed to return immediately for worsening symptoms, change in symptoms or any other concerns.  I discussed this case with my attending physician Dr. Marland Kitchen who cosigned this note including patient's presenting symptoms, physical exam, and planned diagnostics and interventions. Attending physician stated agreement with plan or made changes to plan which  were implemented.     {Document critical care time when appropriate:1} {Document review of labs and clinical decision tools ie heart score, Chads2Vasc2 etc:1}  {Document your independent review of radiology images, and any outside records:1} {Document your discussion with family members, caretakers, and with consultants:1} {Document social determinants of health affecting pt's care:1} {Document your decision making why or why not admission, treatments were needed:1} Final Clinical Impression(s) / ED Diagnoses Final diagnoses:  None    Rx / DC Orders ED Discharge Orders     None

## 2022-06-17 NOTE — ED Provider Notes (Signed)
I provided a substantive portion of the care of this patient.  I personally performed the entirety of the medical decision making for this encounter.  EKG Interpretation  Date/Time:  Wednesday June 17 2022 18:12:18 EST Ventricular Rate:  91 PR Interval:  168 QRS Duration: 72 QT Interval:  496 QTC Calculation: 610 R Axis:   -5 Text Interpretation:  Critical Test Result: Long QTc Normal sinus rhythm Low voltage QRS Possible Inferior infarct , age undetermined Cannot rule out Anteroseptal infarct , age undetermined ST & T wave abnormality, consider lateral ischemia Prolonged QT Abnormal ECG No previous ECGs available Confirmed by Lacretia Leigh ([id number]) on 06/17/2022 8:74:72 PM   62 year old female presents with fatigue and yellow skin.  Patient admits to heavy alcohol use.  On exam, she has jaundice.  Labs are consistent with liver failure.  She is also severely hypokalemic.  Her EKG for interpretation shows prolonged QT as well as U waves.  Patient started on IV as well as oral potassium.  Magnesium level ordered as well to.  Abdominal CT ordered.  Plan will be for hospitalization   Lacretia Leigh, MD 06/17/22 2026

## 2022-06-17 NOTE — ED Notes (Signed)
Patient transported to CT 

## 2022-06-17 NOTE — H&P (Signed)
History and Physical    Patient: Kim Holland BDZ:[id number] DOB: [birth year] DOA: 06/17/2022 DOS: the patient was seen and examined on 06/17/2022 PCP: Thressa Sheller, MD (Inactive)  Patient coming from: Home  Chief Complaint:  Chief Complaint  Patient presents with   Altered Mental Status   HPI: Kim Holland is a 62 y.o. female with medical history significant of alcohol abuse, GERD, essential hypertension, hyperlipidemia, seizure disorder, anxiety disorder, who apparently quit alcohol intake about 3 weeks ago.  Brought in today due to severe jaundice.  Patient apparently has been a heavy drinker in the last 1 year.  She is here with her husband who said she was drinking heavily: Until she quit about 3 weeks ago.  In the last few days however she was noted to have change in her skin color to Bronze, also yellow eyes.  Generalized weakness.  Also patient appears tremulous.  Was seen in the ER and diagnosed with acute hepatic failure.  Patient is being admitted with acute hepatic failure.  GI consulted.  Review of Systems: As mentioned in the history of present illness. All other systems reviewed and are negative. Past Medical History:  Diagnosis Date   Allergy    Anxiety    GERD (gastroesophageal reflux disease)    Hyperlipidemia    Hypertension    Seizures ([place])    only 1 approximately 3 years ago   Skin tags, anus or rectum    Past Surgical History:  Procedure Laterality Date   DENTAL IMPLANTS     Social History:  reports that she quit smoking about 6 years ago. Her smoking use included cigarettes. She has never used smokeless tobacco. She reports current alcohol use of about 12.0 - 14.0 standard drinks of alcohol per week. She reports that she does not use drugs.  Allergies  Allergen Reactions   Latex     CAUSES HIVES,SWELLING   Doxycycline Other (See Comments)   Penicillin G Sodium Other (See Comments)   Ampicillin Rash    Family History  Problem Relation Age of  Onset   Breast cancer Mother    Hypertension Mother    Colon cancer Paternal Aunt    Colon cancer Paternal Grandmother    Hypertension Father    Hypertension Brother    Colon polyps Brother    Esophageal cancer Neg Hx    Rectal cancer Neg Hx    Stomach cancer Neg Hx     Prior to Admission medications   Medication Sig Start Date End Date Taking? Authorizing Provider  ALPRAZolam Duanne Moron) 0.25 MG tablet Take as directed morning of procedure 06/17/17   Ladene Artist, MD  amLODipine (NORVASC) 5 MG tablet Take 5 mg by mouth daily.    [provider]  aspirin 81 MG tablet Take 81 mg by mouth daily.    [provider]  BIOTIN FORTE PO Take 1 tablet by mouth daily.    [provider]  EPINEPHrine (EPIPEN 2-PAK) 0.3 mg/0.3 mL IJ SOAJ injection EpiPen 2-Pak 0.3 mg/0.3 mL injection, auto-injector  use as directed by prescriber    [provider]  glucosamine-chondroitin 500-400 MG tablet Take 1 tablet by mouth 3 (three) times daily.    [provider]  hydrochlorothiazide (MICROZIDE) 12.5 MG capsule Take 12.5 mg by mouth daily.    [provider]  hydrOXYzine (ATARAX/VISTARIL) 25 MG tablet Take 25 mg by mouth 3 (three) times daily.     [provider]  MELATONIN ER PO Take  1 capsule by mouth at bedtime.    [provider]  Multiple Vitamin (MULTIVITAMIN) tablet Take 1 tablet by mouth daily.    [provider]  pravastatin (PRAVACHOL) 10 MG tablet pravastatin 10 mg tablet  1 TABLET ONCE A DAY ORALLY 90 DAYS    [provider]  ranitidine (ZANTAC) 150 MG tablet Take 150 mg by mouth 2 (two) times daily.    [provider]    Physical Exam: Vitals:   06/17/22 1626 06/17/22 1640 06/17/22 2008 06/17/22 2010  BP: (!) 115/96  115/63   Pulse: 98  88   Resp: (!) 22  (!) 27   Temp: 98.2 F (36.8 C)   98.5 F (36.9 C)  TempSrc: Oral   Oral  SpO2: 97%  95%   Weight:  72.6 kg    Height:  '5\' 5"'$  (1.651  m)     Constitutional: Chronically ill looking, [place], calm, comfortable Eyes: PERRL, lids and conjunctivae normal ENMT: Mucous membranes are moist. Posterior pharynx clear of any exudate or lesions.Normal dentition.  Neck: normal, supple, no masses, no thyromegaly Respiratory: clear to auscultation bilaterally, no wheezing, no crackles. Normal respiratory effort. No accessory muscle use.  Cardiovascular: Sinus tachycardia, no murmurs / rubs / gallops. No extremity edema. 2+ pedal pulses. No carotid bruits.  Abdomen: no tenderness, no masses palpated. No hepatosplenomegaly. Bowel sounds positive.  Musculoskeletal: Good range of motion, no joint swelling or tenderness, Skin: no rashes, lesions, ulcers. No induration Neurologic: CN 2-12 grossly intact. Sensation intact, DTR normal. Strength 5/5 in all 4.  Flapping tremor Psychiatric: Normal judgment and insight. Alert and oriented x 3.  Anxious mood  Data Reviewed:  Sodium 127, potassium 2.1, chloride 76, CO2 34, glucose 104, BUN 22 creatinine 1.25 and calcium 7.8.  Gap of 17.  Alkaline phosphatase 190.  Albumin 1.9.  AST 185 ALT 48 total protein 6.0 ammonia 57.  Total bilirubin 12.7.  White count 18,000.  Hemoglobin 14.9 and platelet was 71.  INR 1.7.  Tylenol level less than 10.  Salicylate level 8.7.  Alcohol level is less than 10.  Assessment and Plan:  #1 acute hepatic failure: Most likely due to alcohol use.  Patient reporting that she did not drink since January 2.  The picture however is that of chronic liver disease.  Does not appear to be obstructive in nature.  CT abdomen pelvis currently pending.  GI consulted.  Supportive care until workup is completed.  Acute hepatitis panel will also be ordered.  #2 alcohol abuse history: Patient denied recent alcohol intake.  We will monitor for possible withdrawal symptoms.  Initiate CIWA protocol if symptoms appear.  #3 hyponatremia: Most likely as a result of alcoholism and possible  cirrhosis.  Continue to monitor  #4 GERD: We will order PPIs.  #5 history of seizure disorder: Appears to have been secondary to alcohol withdrawal.  We will watch for seizure precautions  #6 essential hypertension: On amlodipine.  We will continue  #7 AKI: Most likely prerenal.  Monitor BUN/creatinine     Advance Care Planning:   Code Status: Full Code   Consults: GI Dr. Loletha Carrow  Family Communication: Husband at bedside  Severity of Illness: The appropriate patient status for this patient is INPATIENT. Inpatient status is judged to be reasonable and necessary in order to provide the required intensity of service to ensure the patient's safety. The patient's presenting symptoms, physical exam findings, and initial radiographic and laboratory data in the context of their  chronic comorbidities is felt to place them at high risk for further clinical deterioration. Furthermore, it is not anticipated that the patient will be medically stable for discharge from the hospital within 2 midnights of admission.   * I certify that at the point of admission it is my clinical judgment that the patient will require inpatient hospital care spanning beyond 2 midnights from the point of admission due to high intensity of service, high risk for further deterioration and high frequency of surveillance required.*  AuthorBarbette Merino, MD 06/17/2022 10:08 PM  For on call review [email or web address].

## 2022-06-17 NOTE — ED Triage Notes (Signed)
Pt sent in by pcp due to new jaundice and altered mental status for the past 3-4 days. Pt also endorses blood in stool, decreased appetite and n/v. Pt able to answer oriented questions but slow to respond and husband states this is not baseline.

## 2022-06-17 NOTE — ED Provider Triage Note (Signed)
Emergency Medicine Provider Triage Evaluation Note  Kim Holland , a 62 y.o. female  was evaluated in triage.  Pt complains of worsening fatigue, yellow skin discoloration, nausea and vomiting over the last 1 to 2 months.  Was recommended by PCP to come to the ED for further evaluation today.  Denies fever, chills, back pain, recent injury.  Denies recent Tylenol use, though with significant history of alcohol abuse.  Also notes passage of 1 blood clot earlier today, no prior Hx of GI bleed.  Review of Systems  Positive:  Negative: See above  Physical Exam  BP (!) 115/96   Pulse 98   Temp 98.2 F (36.8 C) (Oral)   Resp (!) 22   Ht '5\' 5"'$  (1.651 m)   Wt 72.6 kg   SpO2 97%   BMI 26.63 kg/m  Gen:   Awake, no distress   Resp:  Normal effort  MSK:   Moves extremities without difficulty  Other:  Moderate jaundice.  Appears clinically fatigued.  Abdomen with mild tenderness, mild distension.  Medical Decision Making  Medically screening exam initiated at 5:49 PM.  Appropriate orders placed.  Kim Holland was informed that the remainder of the evaluation will be completed by another provider, this initial triage assessment does not replace that evaluation, and the importance of remaining in the ED until their evaluation is complete.  Triage nurse discussed pt with charge RN, concern for acute liver failure and/or AMS, to come back to next available room when possible   Kim, [place], PA-C 54/65/03 1757

## 2022-06-18 ENCOUNTER — Inpatient Hospital Stay (HOSPITAL_COMMUNITY): Payer: No Typology Code available for payment source

## 2022-06-18 ENCOUNTER — Other Ambulatory Visit: Payer: Self-pay

## 2022-06-18 ENCOUNTER — Encounter (HOSPITAL_COMMUNITY): Payer: Self-pay | Admitting: Internal Medicine

## 2022-06-18 ENCOUNTER — Other Ambulatory Visit (HOSPITAL_COMMUNITY): Payer: No Typology Code available for payment source

## 2022-06-18 DIAGNOSIS — E876 Hypokalemia: Secondary | ICD-10-CM

## 2022-06-18 DIAGNOSIS — K7031 Alcoholic cirrhosis of liver with ascites: Secondary | ICD-10-CM

## 2022-06-18 DIAGNOSIS — K7200 Acute and subacute hepatic failure without coma: Secondary | ICD-10-CM

## 2022-06-18 DIAGNOSIS — K7011 Alcoholic hepatitis with ascites: Secondary | ICD-10-CM | POA: Diagnosis not present

## 2022-06-18 DIAGNOSIS — R7989 Other specified abnormal findings of blood chemistry: Secondary | ICD-10-CM

## 2022-06-18 HISTORY — PX: IR PARACENTESIS: IMG2679

## 2022-06-18 LAB — RESPIRATORY PANEL BY PCR

## 2022-06-18 LAB — COMPREHENSIVE METABOLIC PANEL
ALT: 40 U/L (ref 0–44)
AST: 145 U/L — ABNORMAL HIGH (ref 15–41)
Albumin: 1.5 g/dL — ABNORMAL LOW (ref 3.5–5.0)
Alkaline Phosphatase: 147 U/L — ABNORMAL HIGH (ref 38–126)
Anion gap: 11 (ref 5–15)
BUN: 24 mg/dL — ABNORMAL HIGH (ref 6–20)
CO2: 32 mmol/L (ref 22–32)
Calcium: 7.2 mg/dL — ABNORMAL LOW (ref 8.9–10.3)
Chloride: 83 mmol/L — ABNORMAL LOW (ref 98–111)
Creatinine, Ser: 1.24 mg/dL — ABNORMAL HIGH (ref 0.44–1.00)
GFR, Estimated: 50 mL/min — ABNORMAL LOW (ref >60)
Glucose, Bld: 130 mg/dL — ABNORMAL HIGH (ref 70–99)
Potassium: 2.5 mmol/L — CL (ref 3.5–5.1)
Sodium: 126 mmol/L — ABNORMAL LOW (ref 135–145)
Total Bilirubin: 10.6 mg/dL — ABNORMAL HIGH (ref 0.3–1.2)
Total Protein: 4.8 g/dL — ABNORMAL LOW (ref 6.5–8.1)

## 2022-06-18 LAB — BODY FLUID CELL COUNT WITH DIFFERENTIAL
Eos, Fluid: 0 %
Lymphs, Fluid: 84 %
Monocyte-Macrophage-Serous Fluid: 15 % — ABNORMAL LOW (ref 50–90)
Neutrophil Count, Fluid: 1 % (ref 0–25)
Total Nucleated Cell Count, Fluid: 39 cu mm (ref 0–1000)

## 2022-06-18 LAB — CBC
HCT: 35.2 % — ABNORMAL LOW (ref 36.0–46.0)
Hemoglobin: 12.5 g/dL (ref 12.0–15.0)
MCH: 34.1 pg — ABNORMAL HIGH (ref 26.0–34.0)
MCHC: 35.5 g/dL (ref 30.0–36.0)
MCV: 95.9 fL (ref 80.0–100.0)
Platelets: 131 10*3/uL — ABNORMAL LOW (ref 150–400)
RBC: 3.67 MIL/uL — ABNORMAL LOW (ref 3.87–5.11)
RDW: 13.8 % (ref 11.5–15.5)
WBC: 15.4 10*3/uL — ABNORMAL HIGH (ref 4.0–10.5)
nRBC: 0.1 % (ref 0.0–0.2)

## 2022-06-18 LAB — URINALYSIS, ROUTINE W REFLEX MICROSCOPIC
Glucose, UA: NEGATIVE mg/dL
Ketones, ur: NEGATIVE mg/dL
Nitrite: POSITIVE — AB
Protein, ur: 30 mg/dL — AB
Specific Gravity, Urine: 1.029 (ref 1.005–1.030)
pH: 6 (ref 5.0–8.0)

## 2022-06-18 LAB — TROPONIN I (HIGH SENSITIVITY)
Troponin I (High Sensitivity): 206 ng/L (ref <18)
Troponin I (High Sensitivity): 172 ng/L (ref <18)

## 2022-06-18 LAB — BASIC METABOLIC PANEL
Anion gap: 15 (ref 5–15)
BUN: 25 mg/dL — ABNORMAL HIGH (ref 6–20)
CO2: 31 mmol/L (ref 22–32)
Calcium: 7.7 mg/dL — ABNORMAL LOW (ref 8.9–10.3)
Chloride: 81 mmol/L — ABNORMAL LOW (ref 98–111)
Creatinine, Ser: 1.2 mg/dL — ABNORMAL HIGH (ref 0.44–1.00)
GFR, Estimated: 52 mL/min — ABNORMAL LOW (ref >60)
Glucose, Bld: 129 mg/dL — ABNORMAL HIGH (ref 70–99)
Potassium: 2.8 mmol/L — ABNORMAL LOW (ref 3.5–5.1)
Sodium: 127 mmol/L — ABNORMAL LOW (ref 135–145)

## 2022-06-18 LAB — GRAM STAIN

## 2022-06-18 LAB — HIV ANTIBODY (ROUTINE TESTING W REFLEX): HIV Screen 4th Generation wRfx: NONREACTIVE

## 2022-06-18 LAB — CK: Total CK: 49 U/L (ref 38–234)

## 2022-06-18 LAB — ALBUMIN, PLEURAL OR PERITONEAL FLUID: Albumin, Fluid: <1.5 g/dL

## 2022-06-18 MED ORDER — POTASSIUM CHLORIDE CRYS ER 20 MEQ PO TBCR
40.0000 meq | EXTENDED_RELEASE_TABLET | Freq: Once | ORAL | Status: AC
  Administered 2022-06-18: 40 meq via ORAL
  Filled 2022-06-18: qty 2

## 2022-06-18 MED ORDER — ONDANSETRON HCL 4 MG/2ML IJ SOLN: 4.0000 mg | Freq: Four times a day (QID) | INTRAMUSCULAR | Status: DC | PRN

## 2022-06-18 MED ORDER — ALPRAZOLAM 0.5 MG PO TABS
0.5000 mg | ORAL_TABLET | Freq: Three times a day (TID) | ORAL | Status: DC | PRN
  Administered 2022-06-18 – 2022-06-23 (×3): 0.5 mg via ORAL
  Filled 2022-06-18 (×2): qty 1
  Filled 2022-06-18: qty 2

## 2022-06-18 MED ORDER — LIDOCAINE HCL 1 % IJ SOLN
INTRAMUSCULAR | Status: AC
  Filled 2022-06-18: qty 20

## 2022-06-18 MED ORDER — PREDNISOLONE 5 MG PO TABS
40.0000 mg | ORAL_TABLET | Freq: Every day | ORAL | Status: DC
  Administered 2022-06-18 – 2022-06-24 (×7): 40 mg via ORAL
  Filled 2022-06-18 (×7): qty 8

## 2022-06-18 MED ORDER — ORAL CARE MOUTH RINSE: 15.0000 mL | OROMUCOSAL | Status: DC | PRN

## 2022-06-18 MED ORDER — ONDANSETRON HCL 4 MG PO TABS: 4.0000 mg | ORAL_TABLET | Freq: Four times a day (QID) | ORAL | Status: DC | PRN

## 2022-06-18 MED ORDER — THIAMINE MONONITRATE 100 MG PO TABS
100.0000 mg | ORAL_TABLET | Freq: Every day | ORAL | Status: DC
  Administered 2022-06-18 – 2022-06-24 (×7): 100 mg via ORAL
  Filled 2022-06-18 (×7): qty 1

## 2022-06-18 MED ORDER — HYDROXYZINE HCL 25 MG PO TABS: 25.0000 mg | ORAL_TABLET | Freq: Three times a day (TID) | ORAL | Status: DC

## 2022-06-18 MED ORDER — ADULT MULTIVITAMIN W/MINERALS CH
1.0000 | ORAL_TABLET | Freq: Every day | ORAL | Status: DC
  Administered 2022-06-18 – 2022-06-24 (×7): 1 via ORAL
  Filled 2022-06-18 (×7): qty 1

## 2022-06-18 MED ORDER — POTASSIUM CHLORIDE CRYS ER 20 MEQ PO TBCR
40.0000 meq | EXTENDED_RELEASE_TABLET | ORAL | Status: AC
  Administered 2022-06-18 (×2): 40 meq via ORAL
  Filled 2022-06-18 (×2): qty 2

## 2022-06-18 MED ORDER — LACTULOSE 10 GM/15ML PO SOLN
20.0000 g | Freq: Two times a day (BID) | ORAL | Status: DC
  Administered 2022-06-18 – 2022-06-20 (×5): 20 g via ORAL
  Filled 2022-06-18 (×5): qty 30

## 2022-06-18 MED ORDER — HYDROXYZINE HCL 25 MG PO TABS: 25.0000 mg | ORAL_TABLET | Freq: Three times a day (TID) | ORAL | Status: DC | PRN

## 2022-06-18 MED ORDER — KCL-LACTATED RINGERS-D5W 20 MEQ/L IV SOLN
INTRAVENOUS | Status: DC
  Filled 2022-06-18: qty 1000

## 2022-06-18 MED ORDER — FOLIC ACID 1 MG PO TABS
1.0000 mg | ORAL_TABLET | Freq: Every day | ORAL | Status: DC
  Administered 2022-06-18 – 2022-06-24 (×7): 1 mg via ORAL
  Filled 2022-06-18 (×7): qty 1

## 2022-06-18 MED ORDER — POTASSIUM CHLORIDE IN NACL 20-0.9 MEQ/L-% IV SOLN
INTRAVENOUS | Status: DC
  Filled 2022-06-18 (×5): qty 1000

## 2022-06-18 MED ORDER — POTASSIUM CHLORIDE 10 MEQ/100ML IV SOLN
10.0000 meq | INTRAVENOUS | Status: AC
  Administered 2022-06-18 (×4): 10 meq via INTRAVENOUS
  Filled 2022-06-18 (×4): qty 100

## 2022-06-18 MED ORDER — PANTOPRAZOLE SODIUM 40 MG PO TBEC
40.0000 mg | DELAYED_RELEASE_TABLET | Freq: Every day | ORAL | Status: DC
  Administered 2022-06-18 – 2022-06-24 (×7): 40 mg via ORAL
  Filled 2022-06-18 (×7): qty 1

## 2022-06-18 NOTE — Consult Note (Addendum)
Referring Provider: ? EDP Primary Care Physician:  Thressa Sheller, MD (Inactive) Primary Gastroenterologist:  Dr. Fuller Plan  Reason for Consultation:  Decompensated cirrhosis  HPI: Kim Holland is a 62 y.o. female with medical history significant of alcohol abuse, GERD, essential hypertension, hyperlipidemia, seizure disorder, anxiety disorder, who apparently quit alcohol intake about 3 weeks ago.  Brought in today due to severe jaundice.  Patient apparently has been a heavy drinker, using it to manage her anxiety.  she was drinking heavily until she quit about 3 weeks ago (January 2nd, the day after she started noticing the jaundice).  In the last few days, however, she noticed the jaundice getting worse, having generalized weakness, and was shaking/tremulous.  Was seen in the ER and diagnosed with acute hepatic failure.  WBC count 18K->15.4K, K+ 2.0->2.5, Na+ 126, total bili 12.7->10.6, AST 145, INR 1.7, ammonia 57, Cr 1.24.  CT scan abdomen and pelvis with contrast:  IMPRESSION: 1. Moderate ascites throughout the abdomen and pelvis. 2. Heterogeneous liver with possible focal lesion in the left lobe of the liver. Correlate clinically with LFTs. Recommend further evaluation with MRI. 3. Retroperitoneal varices. 4. Cholelithiasis. 5. Uterine fibroid.   Aortic Atherosclerosis (ICD10-I70.0).  No family at bedside during my visit.  She denies any abdominal pain, nausea, vomiting, dark/bloody stools.  Has noticed abdomen getting bigger/fuller.    Past Medical History:  Diagnosis Date   Allergy    Anxiety    GERD (gastroesophageal reflux disease)    Hyperlipidemia    Hypertension    Seizures ([place])    only 1 approximately 3 years ago   Skin tags, anus or rectum     Past Surgical History:  Procedure Laterality Date   DENTAL IMPLANTS      Prior to Admission medications   Medication Sig Start Date End Date Taking? Authorizing Provider  ALPRAZolam Duanne Moron) 0.25 MG tablet Take  as directed morning of procedure 06/17/17   Ladene Artist, MD  amLODipine (NORVASC) 5 MG tablet Take 5 mg by mouth daily.    [provider]  EPINEPHrine (EPIPEN 2-PAK) 0.3 mg/0.3 mL IJ SOAJ injection EpiPen 2-Pak 0.3 mg/0.3 mL injection, auto-injector  use as directed by prescriber    [provider]  glucosamine-chondroitin 500-400 MG tablet Take 1 tablet by mouth 3 (three) times daily.    [provider]  hydrochlorothiazide (MICROZIDE) 12.5 MG capsule Take 12.5 mg by mouth daily.    [provider]  hydrOXYzine (ATARAX/VISTARIL) 25 MG tablet Take 25 mg by mouth 3 (three) times daily.     [provider]  MELATONIN ER PO Take 1 capsule by mouth at bedtime.    [provider]  Multiple Vitamin (MULTIVITAMIN) tablet Take 1 tablet by mouth daily.    [provider]  pravastatin (PRAVACHOL) 10 MG tablet pravastatin 10 mg tablet  1 TABLET ONCE A DAY ORALLY 90 DAYS    [provider]  ranitidine (ZANTAC) 150 MG tablet Take 150 mg by mouth 2 (two) times daily.    [provider]    Current Facility-Administered Medications  Medication Dose Route Frequency Provider Last Rate Last Admin   0.9 % NaCl with KCl 20 mEq/ L  infusion   Intravenous Continuous Regalado, Belkys A, MD 75 mL/hr at 06/18/22 0906 Infusion Verify at 06/18/22 0906   ALPRAZolam (XANAX) tablet 0.5 mg  0.5 mg Oral TID PRN Elwyn Reach, MD   0.5 mg at 81/19/14 7829   folic acid (FOLVITE)  tablet 1 mg  1 mg Oral Daily Elwyn Reach, MD       multivitamin with minerals tablet 1 tablet  1 tablet Oral Daily Jonelle Sidle, Mohammad L, MD       ondansetron (ZOFRAN) tablet 4 mg  4 mg Oral Q6H PRN Elwyn Reach, MD       Or   ondansetron (ZOFRAN) injection 4 mg  4 mg Intravenous Q6H PRN Elwyn Reach, MD       Oral care mouth rinse  15 mL Mouth Rinse PRN Regalado, Belkys A, MD       potassium chloride 10 mEq in 100 mL IVPB  10 mEq Intravenous Q1 Hr x 4  Regalado, Belkys A, MD 100 mL/hr at 06/18/22 0906 Infusion Verify at 06/18/22 0906   potassium chloride SA (KLOR-CON M) CR tablet 40 mEq  40 mEq Oral Q4H Regalado, Belkys A, MD       thiamine (VITAMIN B1) tablet 100 mg  100 mg Oral Daily Elwyn Reach, MD        Allergies as of 06/17/2022 - Review Complete 06/17/2022  Allergen Reaction Noted   Latex  10/03/2013   Doxycycline Other (See Comments) 06/17/2022   Penicillin g sodium Other (See Comments) 06/17/2022   Ampicillin Rash 06/17/2022    Family History  Problem Relation Age of Onset   Breast cancer Mother    Hypertension Mother    Colon cancer Paternal Aunt    Colon cancer Paternal Grandmother    Hypertension Father    Hypertension Brother    Colon polyps Brother    Esophageal cancer Neg Hx    Rectal cancer Neg Hx    Stomach cancer Neg Hx     Social History   Socioeconomic History   Marital status: Married    Spouse name: Not on file   Number of children: Not on file   Years of education: Not on file   Highest education level: Not on file  Occupational History   Not on file  Tobacco Use   Smoking status: Former    Types: Cigarettes    Quit date: 06/11/2016    Years since quitting: 6.0   Smokeless tobacco: Never   Tobacco comments:    occasionally smokes on rare occasion  Substance and Sexual Activity   Alcohol use: Yes    Alcohol/week: 12.0 - 14.0 standard drinks of alcohol    Types: 12 - 14 Glasses of wine per week    Comment: 2 glasses daily   Drug use: No   Sexual activity: Not on file  Other Topics Concern   Not on file  Social History Narrative   Not on file   Social Determinants of Health   Financial Resource Strain: Not on file  Food Insecurity: Unknown (06/18/2022)   Hunger Vital Sign    Worried About Running Out of Food in the Last Year: Patient refused    Ran Out of Food in the Last Year: Patient refused  Transportation Needs: Not on file  Physical Activity: Not on file  Stress: Not on  file  Social Connections: Not on file  Intimate Partner Violence: Not on file    Review of Systems: ROS is O/W negative except as mentioned in HPI.  Physical Exam: Vital signs in last 24 hours: Temp:  [97.7 F (36.5 C)-98.5 F (36.9 C)] 98 F (36.7 C) (01/25 0922) Pulse Rate:  [79-98] 83 (01/25 0922) Resp:  [14-27] 18 (01/25 0922) BP: (90-121)/(52-98) 95/52 (01/25  9937) SpO2:  [90 %-97 %] 90 % (01/25 0922) Weight:  [72.6 kg-75.5 kg] 75.5 kg (01/25 0538) Last BM Date : 06/16/22 General:  Alert, Well-developed, well-nourished, pleasant and cooperative in NAD; tremulous  Head:  Normocephalic and atraumatic. Eyes:  Scleral icterus noted. Ears:  Normal auditory acuity. Mouth:  No deformity or lesions.   Lungs:  Clear throughout to auscultation.  No wheezes, crackles, or rhonchi.  Heart:  Regular rate and rhythm; no murmurs, clicks, rubs, or gallops. Abdomen:  Softly distended.  BS present.  Non-tender. Msk:  Symmetrical without gross deformities. Pulses:  Normal pulses noted. Extremities:  Without clubbing or edema. Neurologic:  Alert and oriented x 4;  grossly normal neurologically.  Tremulous and asterixis noted. Skin:  Intact without significant lesions or rashes. Psych:  Alert and cooperative. Normal mood and affect.  Intake/Output from previous day: 01/24 0701 - 01/25 0700 In: 672.8 [I.V.:572.8; IV Piggyback:100] Out: 0  Intake/Output this shift: Total I/O In: 487.2 [I.V.:337.7; IV Piggyback:149.5] Out: -   Lab Results: Recent Labs    06/17/22 1730 06/18/22 0424  WBC 18.0* 15.4*  HGB 14.9 12.5  HCT 40.4 35.2*  PLT 171 131*   BMET Recent Labs    06/17/22 1730 06/18/22 0424  NA 127* 126*  K 2.0* 2.5*  CL 76* 83*  CO2 34* 32  GLUCOSE 104* 130*  BUN 22* 24*  CREATININE 1.25* 1.24*  CALCIUM 7.8* 7.2*   LFT Recent Labs    06/18/22 0424  PROT 4.8*  ALBUMIN 1.5*  AST 145*  ALT 40  ALKPHOS 147*  BILITOT 10.6*   PT/INR Recent Labs     06/17/22 1750  LABPROT 19.4*  INR 1.7*   Studies/Results: CT Abdomen Pelvis W Contrast  Result Date: 06/17/2022 CLINICAL DATA:  Pancreatitis.  Jaundice.  Altered mental status. EXAM: CT ABDOMEN AND PELVIS WITH CONTRAST TECHNIQUE: Multidetector CT imaging of the abdomen and pelvis was performed using the standard protocol following bolus administration of intravenous contrast. RADIATION DOSE REDUCTION: This exam was performed according to the departmental dose-optimization program which includes automated exposure control, adjustment of the mA and/or kV according to patient size and/or use of iterative reconstruction technique. CONTRAST:  29m OMNIPAQUE IOHEXOL 350 MG/ML SOLN COMPARISON:  None Available. FINDINGS: Lower chest: There is a small amount of patchy atelectasis and airspace disease in the right lung base. Hepatobiliary: Gallstones are present. There is no biliary ductal dilatation. Liver is diffusely heterogeneous. Can not exclude subtle focal rounded lesion in the left lobe of the liver measuring 3.1 cm image 3/28. This may represent focal fat in the gallbladder fossa. Pancreas: Unremarkable. No pancreatic ductal dilatation or surrounding inflammatory changes. Spleen: Normal in size without focal abnormality. Adrenals/Urinary Tract: Adrenal glands are unremarkable. Kidneys are normal, without renal calculi, focal lesion, or hydronephrosis. Bladder is unremarkable. Stomach/Bowel: There is a small hiatal hernia. The stomach is otherwise within normal limits. There is no evidence for bowel obstruction, pneumatosis or free air. The appendix is not visualized. Vascular/Lymphatic: Aortic atherosclerosis. No enlarged abdominal or pelvic lymph nodes. 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 Reproductive: Uterine fibroid is present measuring 19 mm. Ovaries are unremarkable. Other: There is moderate ascites throughout the abdomen and pelvis. There is no focal abdominal wall hernia. Musculoskeletal: No acute or  significant osseous findings. IMPRESSION: 1. Moderate ascites throughout the abdomen and pelvis. 2. Heterogeneous liver with possible focal lesion in the left lobe of the liver. Correlate clinically with LFTs. Recommend further evaluation with MRI. 3. Retroperitoneal varices. 4.  Cholelithiasis. 5. Uterine fibroid. Aortic Atherosclerosis (ICD10-I70.0). Electronically Signed   By: Ronney Asters M.D.   On: 06/17/2022 22:10   CT Head Wo Contrast  Result Date: 06/17/2022 CLINICAL DATA:  Altered mental status. EXAM: CT HEAD WITHOUT CONTRAST TECHNIQUE: Contiguous axial images were obtained from the base of the skull through the vertex without intravenous contrast. RADIATION DOSE REDUCTION: This exam was performed according to the departmental dose-optimization program which includes automated exposure control, adjustment of the mA and/or kV according to patient size and/or use of iterative reconstruction technique. COMPARISON:  None Available. FINDINGS: Brain: There is mild cerebral atrophy with widening of the extra-axial spaces and ventricular dilatation. There are areas of decreased attenuation within the white matter tracts of the supratentorial brain, consistent with microvascular disease changes. Vascular: No hyperdense vessel or unexpected calcification. Skull: Normal. Negative for fracture or focal lesion. Sinuses/Orbits: No acute finding. Other: None. IMPRESSION: 1. No acute intracranial abnormality. 2. Cerebral atrophy and microvascular disease changes of the supratentorial brain. Electronically Signed   By: Virgina Norfolk M.D.   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 *Hypokalemia:  K+ still 2.5 this AM *Hyponatremia:  Na+ still 126 this AM *Coagulopathy with INR 1.7 *AKI:  Cr 1.24 *ETOH abuse:  Says that she stopped drinking January 2nd after she started noticing the jaundice the day  before. *Slightly elevated ammonia at 57 with asterixis and shaking *Leukocytosis:  Improving at 15.4K today.  No fever.  ? Reactive. *Elevated troponin *Possible liver lesion seen on CT scan  PLAN: *Correct electrolytes per primary service. *Trend labs. *Check AFP level. *Will need MRI abdomen at some point. *EGD at some point, but possibly even as outpatient. *Needs complete ETOH cessation. *CIWA protocol. *May benefit from steroids in the form of prednisolone as long as no concern for infection.  Has elevated WBC count but could be reactive and is improving.  No fevers. *Likely will need diagnostic tap to rule out SBP and check cytology, etc. *Agree with lactulose that was started this AM.   Laban Emperor. Zehr  06/18/2022, 9:28 AM    Attending physician's note  I have taken a history, reviewed the chart and examined the patient. I performed a substantive portion of this encounter, including complete performance of at least one of the key components, in conjunction with the APP. I agree with the APP's note, impression and recommendations.    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  UA negative.  No sign of active infection Start IV prednisolone for acute alcoholic hepatitis  Discussed alcohol cessation Monitor for alcohol withdrawal Low-sodium diet  GI will continue to follow along  The patient was provided an opportunity to ask questions and all were answered. The patient agreed with the plan and demonstrated an understanding of the instructions.  Damaris Hippo , MD [phone]

## 2022-06-18 NOTE — ED Notes (Signed)
ED TO INPATIENT HANDOFF REPORT  ED Nurse Name and Phone #: Ferne Reus [phone]  S Name/Age/Gender Kim Holland 62 y.o. female Room/Bed: 756E/332R  Code Status   Code Status: Full Code  Home/SNF/Other Home Patient oriented to: self, place, time, and situation Is this baseline? Yes   Triage Complete: Triage complete  Chief Complaint Acute hepatic failure [K72.00]  Triage Note Pt sent in by pcp due to new jaundice and altered mental status for the past 3-4 days. Pt also endorses blood in stool, decreased appetite and n/v. Pt able to answer oriented questions but slow to respond and husband states this is not baseline.      Allergies Allergies  Allergen Reactions   Latex     CAUSES HIVES,SWELLING   Doxycycline Other (See Comments)   Penicillin G Sodium Other (See Comments)   Ampicillin Rash    Level of Care/Admitting Diagnosis ED Disposition     ED Disposition  Admit   Condition  --   [place]: Vivian [[id number]]  Level of Care: Telemetry Medical [104]  May admit patient to [facility] or [facility] if equivalent level of care is available:: Yes  Covid Evaluation: Confirmed COVID Negative  Diagnosis: Acute hepatic failure [518841]  Admitting Physician: Elwyn Reach [[id number]]  Attending Physician: Elwyn Reach [[id number]]  Certification:: I certify this patient will need inpatient services for at least 2 midnights  Estimated Length of Stay: 4          B Medical/Surgery History Past Medical History:  Diagnosis Date   Allergy    Anxiety    GERD (gastroesophageal reflux disease)    Hyperlipidemia    Hypertension    Seizures ([place])    only 1 approximately 3 years ago   Skin tags, anus or rectum    Past Surgical History:  Procedure Laterality Date   DENTAL IMPLANTS       A IV Location/Drains/Wounds Patient Lines/Drains/Airways Status     Active Line/Drains/Airways     Name Placement date Placement time  Site Days   Peripheral IV 06/17/22 20 G Anterior;Proximal;Right Forearm 06/17/22  2052  Forearm  1   External Urinary Catheter 06/18/22  0033  --  less than 1            Intake/Output Last 24 hours  Intake/Output Summary (Last 24 hours) at 06/18/2022 0322 Last data filed at 06/18/2022 0106 Gross per 24 hour  Intake 100 ml  Output --  Net 100 ml    Labs/Imaging Results for orders placed or performed during the hospital encounter of 06/17/22 (from the past 48 hour(s))  CBG monitoring, ED     Status: Abnormal   Collection Time: 06/17/22  5:26 PM  Result Value Ref Range   Glucose-Capillary 120 (H) 70 - 99 mg/dL    Comment: Glucose reference range applies only to samples taken after fasting for at least 8 hours.  Comprehensive metabolic panel     Status: Abnormal   Collection Time: 06/17/22  5:30 PM  Result Value Ref Range   Sodium 127 (L) 135 - 145 mmol/L   Potassium 2.0 (LL) 3.5 - 5.1 mmol/L    Comment: CRITICAL RESULT CALLED TO, READ BACK BY AND VERIFIED WITH P PULLIAM RN 06/17/2022 1853 B NUNNERY   Chloride 76 (L) 98 - 111 mmol/L   CO2 34 (H) 22 - 32 mmol/L   Glucose, Bld 104 (H) 70 - 99 mg/dL    Comment: Glucose  reference range applies only to samples taken after fasting for at least 8 hours.   BUN 22 (H) 6 - 20 mg/dL   Creatinine, Ser 1.25 (H) 0.44 - 1.00 mg/dL   Calcium 7.8 (L) 8.9 - 10.3 mg/dL   Total Protein 6.0 (L) 6.5 - 8.1 g/dL   Albumin 1.9 (L) 3.5 - 5.0 g/dL   AST 185 (H) 15 - 41 U/L   ALT 48 (H) 0 - 44 U/L   Alkaline Phosphatase 190 (H) 38 - 126 U/L   Total Bilirubin 12.7 (H) 0.3 - 1.2 mg/dL   GFR, Estimated 49 (L) >60 mL/min    Comment: (NOTE) Calculated using the CKD-EPI Creatinine Equation (2021)    Anion gap 17 (H) 5 - 15    Comment: Performed at [facility], [place] [address] [place], [place] [address]  CBC     Status: Abnormal   Collection Time: 06/17/22  5:30 PM  Result Value Ref Range   WBC 18.0 (H) 4.0 - 10.5 K/uL   RBC 4.33 3.87 - 5.11  MIL/uL   Hemoglobin 14.9 12.0 - 15.0 g/dL   HCT 40.4 36.0 - 46.0 %   MCV 93.3 80.0 - 100.0 fL   MCH 34.4 (H) 26.0 - 34.0 pg   MCHC 36.9 (H) 30.0 - 36.0 g/dL   RDW 13.8 11.5 - 15.5 %   Platelets 171 150 - 400 K/uL   nRBC 0.1 0.0 - 0.2 %    Comment: Performed at [facility], [place] [address], [place], [place] [address]  Acetaminophen level     Status: Abnormal   Collection Time: 06/17/22  5:30 PM  Result Value Ref Range   Acetaminophen (Tylenol), Serum <10 (L) 10 - 30 ug/mL    Comment: (NOTE) Therapeutic concentrations vary significantly. A range of 10-30 ug/mL  may be an effective concentration for many patients. However, some  are best treated at concentrations outside of this range. Acetaminophen concentrations >150 ug/mL at 4 hours after ingestion  and >50 ug/mL at 12 hours after ingestion are often associated with  toxic reactions.  Performed at [facility], [place] [address] [place], [place] [address]   Ethanol     Status: None   Collection Time: 06/17/22  5:30 PM  Result Value Ref Range   Alcohol, Ethyl (B) <10 <10 mg/dL    Comment: (NOTE) Lowest detectable limit for serum alcohol is 10 mg/dL.  For medical purposes only. Performed at [facility], [place] [address], [place], [place] [address]   Salicylate level     Status: None   Collection Time: 06/17/22  5:30 PM  Result Value Ref Range   Salicylate Lvl 8.7 7.0 - 30.0 mg/dL    Comment: ICTERUS AT THIS LEVEL MAY AFFECT RESULT Performed at [facility], [place] [address], [place], [place] [address]   Ammonia     Status: Abnormal   Collection Time: 06/17/22  5:49 PM  Result Value Ref Range   Ammonia 57 (H) 9 - 35 umol/L    Comment: Performed at [facility], [place] [address] [place], [place] [address]  Protime-INR     Status: Abnormal   Collection Time: 06/17/22  5:50 PM  Result Value Ref Range   Prothrombin Time 19.4 (H) 11.4 - 15.2 seconds   INR 1.7 (H) 0.8 - 1.2    Comment: (NOTE) INR  goal varies based on device and disease states. Performed at [facility], [address], [place], [place] [address]   Troponin I (High Sensitivity)     Status: Abnormal   Collection Time: 06/17/22  8:56 PM  Result Value Ref Range   Troponin I (High Sensitivity) 303 (HH) <18 ng/L    Comment: CRITICAL RESULT CALLED TO, READ BACK BY AND VERIFIED WITH J. Javares Kaufhold,RN. 2342 06/17/22. LPAIT (NOTE) Elevated high sensitivity troponin I (hsTnI) values and significant  changes across serial measurements may suggest ACS but many other  chronic and acute conditions are known to elevate hsTnI results.  Refer to the "Links" section for chest pain algorithms and additional  guidance. Performed at [facility], [place] [address] [place], [place] [address]   Magnesium     Status: Abnormal   Collection Time: 06/17/22  8:56 PM  Result Value Ref Range   Magnesium 2.6 (H) 1.7 - 2.4 mg/dL    Comment: Performed at [facility] [address] [place], [place] [address]  Lipase, blood     Status: Abnormal   Collection Time: 06/17/22  8:56 PM  Result Value Ref Range   Lipase 55 (H) 11 - 51 U/L    Comment: Performed at [facility] [address], [place], [place] [address]   CT Abdomen Pelvis W Contrast  Result Date: 06/17/2022 CLINICAL DATA:  Pancreatitis.  Jaundice.  Altered mental status. EXAM: CT ABDOMEN AND PELVIS WITH CONTRAST TECHNIQUE: Multidetector CT imaging of the abdomen and pelvis was performed using the standard protocol following bolus administration of intravenous contrast. RADIATION DOSE REDUCTION: This exam was performed according to the departmental dose-optimization program which includes automated exposure control, adjustment of the mA and/or kV according to patient size and/or use of iterative reconstruction technique. CONTRAST:  53m OMNIPAQUE IOHEXOL 350 MG/ML SOLN COMPARISON:  None Available. FINDINGS: Lower chest: There is a small amount of patchy atelectasis and  airspace disease in the right lung base. Hepatobiliary: Gallstones are present. There is no biliary ductal dilatation. Liver is diffusely heterogeneous. Can not exclude subtle focal rounded lesion in the left lobe of the liver measuring 3.1 cm image 3/28. This may represent focal fat in the gallbladder fossa. Pancreas: Unremarkable. No pancreatic ductal dilatation or surrounding inflammatory changes. Spleen: Normal in size without focal abnormality. Adrenals/Urinary Tract: Adrenal glands are unremarkable. Kidneys are normal, without renal calculi, focal lesion, or hydronephrosis. Bladder is unremarkable. Stomach/Bowel: There is a small hiatal hernia. The stomach is otherwise within normal limits. There is no evidence for bowel obstruction, pneumatosis or free air. The appendix is not visualized. Vascular/Lymphatic: Aortic atherosclerosis. No enlarged abdominal or pelvic lymph nodes. 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 Reproductive: Uterine fibroid is present measuring 19 mm. Ovaries are unremarkable. Other: There is moderate ascites throughout the abdomen and pelvis. There is no focal abdominal wall hernia. Musculoskeletal: No acute or significant osseous findings. IMPRESSION: 1. Moderate ascites throughout the abdomen and pelvis. 2. Heterogeneous liver with possible focal lesion in the left lobe of the liver. Correlate clinically with LFTs. Recommend further evaluation with MRI. 3. Retroperitoneal varices. 4. Cholelithiasis. 5. Uterine fibroid. Aortic Atherosclerosis (ICD10-I70.0). Electronically Signed   By: ARonney AstersM.D.   On: 06/17/2022 22:10   CT Head Wo Contrast  Result Date: 06/17/2022 CLINICAL DATA:  Altered mental status. EXAM: CT HEAD WITHOUT CONTRAST TECHNIQUE: Contiguous axial images were obtained from the base of the skull through the vertex without intravenous contrast. RADIATION DOSE REDUCTION: This exam was performed according to the departmental dose-optimization program which includes  automated exposure control, adjustment of the mA and/or kV according to patient size and/or use of iterative reconstruction technique. COMPARISON:  None Available. FINDINGS: Brain: There is mild cerebral atrophy with widening of the extra-axial spaces and ventricular dilatation. There are areas of decreased attenuation within the white matter tracts of the supratentorial brain, consistent with microvascular disease changes. Vascular: No hyperdense vessel or unexpected calcification. Skull: Normal. Negative for fracture or focal lesion. Sinuses/Orbits: No acute finding. Other: None. IMPRESSION: 1. No acute intracranial abnormality. 2. Cerebral atrophy and microvascular disease changes of the supratentorial brain. Electronically Signed   By: Virgina Norfolk M.D.   On: 06/17/2022 19:13    Pending Labs Unresulted Labs (From admission, onward)     Start     Ordered   06/18/22 0500  Comprehensive metabolic panel  Tomorrow morning,   R        06/18/22 0030   06/18/22 0500  CBC  Tomorrow morning,   R        06/18/22 0030   06/18/22 0031  HIV Antibody (routine testing w rflx)  (HIV Antibody (Routine testing w reflex) panel)  Once,   R        06/18/22 0030            Vitals/Pain Today's Vitals   06/17/22 2010 06/17/22 2215 06/17/22 2355 06/18/22 0045  BP:  (!) 121/98  (!) 109/58  Pulse:  82  82  Resp:  18  (!) 25  Temp: 98.5 F (36.9 C)  98.3 F (36.8 C)   TempSrc: Oral  Oral   SpO2:  91%  91%  Weight:      Height:      PainSc:        Isolation Precautions No active isolations  Medications Medications  ALPRAZolam (XANAX) tablet 0.5 mg (0.5 mg Oral Given 06/18/22 0036)  hydrOXYzine (ATARAX) tablet 25 mg (has no administration in time range)  dextrose 5% in lactated ringers with KCl 20 mEq/L infusion ( Intravenous New Bag/Given 06/18/22 0118)  ondansetron (ZOFRAN) tablet 4 mg (has no administration in time range)    Or  ondansetron (ZOFRAN) injection 4 mg (has no administration in  time range)  folic acid (FOLVITE) tablet 1 mg (has no administration in time range)  multivitamin with minerals tablet 1 tablet (has no administration in time range)  thiamine (VITAMIN B1) tablet 100 mg (has no administration in time range)  potassium chloride 10 mEq in 100 mL IVPB (0 mEq Intravenous Stopped 06/18/22 0106)  potassium chloride SA (KLOR-CON M) CR tablet 60 mEq (60 mEq Oral Given 06/17/22 2045)  iohexol (OMNIPAQUE) 350 MG/ML injection 65 mL (65 mLs Intravenous Contrast Given 06/17/22 2151)    Mobility Have not seen patient walk, but she is on the weaker side, I think her baseline is able to ambulate, has a purewick on      Focused Assessments Neuro Assessment Handoff:  Swallow screen pass? Yes          Neuro Assessment: Within Defined Limits Neuro Checks:      Has TPA been given? No If patient is a Neuro Trauma and patient is going to OR before floor call report to [place] nurse: [phone] or [phone]   R Recommendations: See Admitting Provider Note  Report given to:   Additional Notes: can lift hips well, uses a bedpan if needed, on 2L  just while sleeping

## 2022-06-18 NOTE — Plan of Care (Signed)
  Problem: Education: Goal: Knowledge of General Education information will improve Description: Including pain rating scale, medication(s)/side effects and non-pharmacologic comfort measures Outcome: Progressing

## 2022-06-18 NOTE — ED Notes (Signed)
Placed patient on 2L [place] at this time due to desatting to 88% on RA while sleeping. Patient sats 95% on 2L at this time.

## 2022-06-18 NOTE — Plan of Care (Signed)
  Problem: Clinical Measurements: Goal: Respiratory complications will improve Outcome: Progressing Goal: Cardiovascular complication will be avoided Outcome: Progressing   Problem: Elimination: Goal: Will not experience complications related to urinary retention Outcome: Progressing   Problem: Activity: Goal: Risk for activity intolerance will decrease Outcome: Not Progressing

## 2022-06-18 NOTE — TOC Progression Note (Addendum)
Transition of Care [facility]) - Initial/Assessment Note    Patient Details  Name: Kim Holland MRN: [id number] Date of Birth: 05-30-1960  Transition of Care [facility]) CM/SW Contact:    Milinda Antis, LCSWA Phone Number: 06/18/2022, 8:50 AM  Clinical Narrative:                 Transition of Care Department [facility]) has reviewed patient.  TOC consulted for substance use.   Substance use resources have been added to the discharge instructions.  We will continue to monitor patient advancement through interdisciplinary progression rounds. If new patient transition needs arise, please place a TOC consult.    Patient Goals and CMS Choice            Expected Discharge Plan and Services                                              Prior Living Arrangements/Services                       Activities of Daily Living Home Assistive Devices/Equipment: None ADL Screening (condition at time of admission) Patient's cognitive ability adequate to safely complete daily activities?: Yes Is the patient deaf or have difficulty hearing?: No Does the patient have difficulty seeing, even when wearing glasses/contacts?: No Does the patient have difficulty concentrating, remembering, or making decisions?: No Patient able to express need for assistance with ADLs?: Yes Does the patient have difficulty dressing or bathing?: No Independently performs ADLs?: Yes (appropriate for developmental age) Does the patient have difficulty walking or climbing stairs?: No Weakness of Legs: None Weakness of Arms/Hands: None  Permission Sought/Granted                  Emotional Assessment              Admission diagnosis:  Hypokalemia [E87.6] Acute hepatic failure [K72.00] Acute liver failure without hepatic coma [K72.00] Patient Active Problem List   Diagnosis Date Noted   Acute hepatic failure 06/17/2022   GERD (gastroesophageal reflux disease) 06/17/2022   Hyperlipidemia 06/17/2022    Hyponatremia 06/17/2022   Hypokalemia 06/17/2022   Leucocytosis 06/17/2022   Alcohol abuse 06/17/2022   AKI (acute kidney injury) ([place]) 06/17/2022   Essential hypertension 12/25/2013   Seizure ([place]) 12/25/2013   PCP:  Thressa Sheller, MD (Inactive) Pharmacy:   [facility] [place] [place] [address] [place] [address] [place] [address] Phone: [phone] Fax: [phone]  [id number] [place] [place]Phone: [phone]: [phone]    Social Determinants of Health (SDOH) Social History: SDOH Screenings   Food Insecurity: Unknown (06/18/2022)  Tobacco Use: Medium Risk (06/18/2022)   SDOH Interventions:     Readmission Risk Interventions     No data to display

## 2022-06-18 NOTE — Procedures (Signed)
PROCEDURE SUMMARY:  Successful image-guided paracentesis from the right lower abdomen.  Yielded 1 liters of hazy yellow fluid.  No immediate complications.  EBL = trace. Patient tolerated well.   Specimen was sent for labs.  Please see imaging section of Epic for full dictation.  If the patient eventually requires >/=2 paracenteses in a 30 day period, screening evaluation by the [facility] will be assessed.   Armando Gang Eron Goble PA-C 06/18/2022 12:40 PM

## 2022-06-18 NOTE — Consult Note (Signed)
Cardiology Consultation   Patient ID: Kim Holland MRN: [id number]; DOB: [birth year]  Admit date: 06/17/2022 Date of Consult: 06/18/2022  PCP:  Thressa Sheller, MD (Inactive)   [facility] Cardiologist:  New (Dr. Stanford Breed)  Patient Profile:   Kim Holland is a 62 y.o. female with a history of hypertension, hyperlipidemia, GERD, and seizures who is being seen 06/18/2022 for the evaluation of elevated troponin at the request of Dr. Tyrell Antonio.  History of Present Illness:   Kim Holland is a 62 year old female with the above history..  Patient presented to the ED on 06/17/2022 from PCP's office for evaluation of altered mental status and new jaundice. Upon arrival to the ED, vitals stable. EKG showed normal sinus rhythm with underlying baseline wandering and non-specific ST/T changes and prolonged QTc of 610 ms. High-sensitivity troponin 303 >> 206. WBC 18, Hgb 14.9, Plts 171. Na 127, K 2.0, Cl 76, Glucose 104, BUN 22, Cr 1.25. Total Protein 6.0, Albumin 1.9, AST 185, ALT 48, Alk Phos 190, Total Bili 12.7. Magnesium 2.6.  Ammonia elevated at 57. Lipase minimally elevated at 55. Acetaminophen. Salicylate, and ETOH levels normal. Head CT showed no acute intracranial abnormalities. Abdominal/ pelvic CT showed heterogeneous liver with possible focal lesion in the left lobe of the liver, moderate ascites throughout the abdomen and pelvis, and retroperitoneal varices.    Patient was admitted for acute hepatic failure. Cardiology consulted for elevated troponin.   Past Medical History:  Diagnosis Date   Allergy    Anxiety    GERD (gastroesophageal reflux disease)    Hyperlipidemia    Hypertension    Seizures ([place])    only 1 approximately 3 years ago   Skin tags, anus or rectum     Past Surgical History:  Procedure Laterality Date   DENTAL IMPLANTS       Inpatient Medications: Scheduled Meds:  folic acid  1 mg Oral Daily   lactulose  20 g Oral BID    multivitamin with minerals  1 tablet Oral Daily   potassium chloride  40 mEq Oral Q4H   thiamine  100 mg Oral Daily   Continuous Infusions:  0.9 % NaCl with KCl 20 mEq / L 75 mL/hr at 06/18/22 0906   PRN Meds: ALPRAZolam, ondansetron **OR** ondansetron (ZOFRAN) IV, mouth rinse  Allergies:    Allergies  Allergen Reactions   Latex     CAUSES HIVES,SWELLING   Doxycycline Other (See Comments)   Penicillin G Sodium Other (See Comments)   Ampicillin Rash    Social History:   Social History   Socioeconomic History   Marital status: Married    Spouse name: Not on file   Number of children: Not on file   Years of education: Not on file   Highest education level: Not on file  Occupational History   Not on file  Tobacco Use   Smoking status: Former    Types: Cigarettes    Quit date: 06/11/2016    Years since quitting: 6.0   Smokeless tobacco: Never   Tobacco comments:    occasionally smokes on rare occasion  Substance and Sexual Activity   Alcohol use: Yes    Alcohol/week: 12.0 - 14.0 standard drinks of alcohol    Types: 12 - 14 Glasses of wine per week    Comment: 2 glasses daily   Drug use: No   Sexual activity: Not on file  Other Topics Concern   Not on file  Social  History Narrative   Not on file   Social Determinants of Health   Financial Resource Strain: Not on file  Food Insecurity: Unknown (06/18/2022)   Hunger Vital Sign    Worried About Running Out of Food in the Last Year: Patient refused    Ran Out of Food in the Last Year: Patient refused  Transportation Needs: Not on file  Physical Activity: Not on file  Stress: Not on file  Social Connections: Not on file  Intimate Partner Violence: Not on file    Family History:    Family History  Problem Relation Age of Onset   Breast cancer Mother    Hypertension Mother    Colon cancer Paternal Aunt    Colon cancer Paternal Grandmother    Hypertension Father    Hypertension Brother    Colon polyps Brother     Esophageal cancer Neg Hx    Rectal cancer Neg Hx    Stomach cancer Neg Hx      ROS:  Please see the history of present illness.  Jaundice, weakness and abdominal distention. All other ROS reviewed and negative.     Physical Exam/Data:   Vitals:   06/18/22 0430 06/18/22 0538 06/18/22 0540 06/18/22 0922  BP: (!) 106/54  (!) 106/57 (!) 95/52  Pulse: 80  81 83  Resp:   19 18  Temp:   97.7 F (36.5 C) 98 F (36.7 C)  TempSrc:   Oral   SpO2: 94%  93% 90%  Weight:  75.5 kg    Height:  '5\' 5"'$  (1.651 m)      Intake/Output Summary (Last 24 hours) at 06/18/2022 1222 Last data filed at 06/18/2022 0906 Gross per 24 hour  Intake 1160.02 ml  Output 0 ml  Net 1160.02 ml      06/18/2022    5:38 AM 06/17/2022    4:40 PM 06/22/2017    7:58 AM  Last 3 Weights  Weight (lbs) 166 lb 7.2 oz 160 lb 179 lb  Weight (kg) 75.5 kg 72.576 kg 81.194 kg     Body mass index is 27.7 kg/m.  General:  Well nourished, chronically ill appearing, in no acute distress HEENT: scleral icterus Neck: no JVD Vascular: No carotid bruits; Distal pulses 2+ bilaterally Cardiac:  normal S1, S2; RRR; no murmur  Lungs:  clear to auscultation bilaterally, no wheezing, rhonchi or rales  Abd: NT, ascites noted Ext: no edema Musculoskeletal:  No deformities, BUE and BLE strength normal and equal Skin: warm and dry  Neuro:  CNs 2-12 intact, no focal abnormalities noted Psych:  Normal affect   EKG:  The EKG was personally reviewed and demonstrates:  Normal sinus rhythm, rate 91 bpm, with underlying baseline wandering and non-specific ST/T changes and prolonged QTc of 610 ms. Telemetry:  Telemetry was personally reviewed and demonstrates:  Sinus  Laboratory Data:  High Sensitivity Troponin:   Recent Labs  Lab 06/17/22 2056 06/18/22 0424 06/18/22 1045  TROPONINIHS 303* 206* 172*     Chemistry Recent Labs  Lab 06/17/22 1730 06/17/22 2056 06/18/22 0424 06/18/22 1045  NA 127*  --  126* 127*  K 2.0*  --   2.5* 2.8*  CL 76*  --  83* 81*  CO2 34*  --  32 31  GLUCOSE 104*  --  130* 129*  BUN 22*  --  24* 25*  CREATININE 1.25*  --  1.24* 1.20*  CALCIUM 7.8*  --  7.2* 7.7*  MG  --  2.6*  --   --  GFRNONAA 49*  --  50* 52*  ANIONGAP 17*  --  11 15    Recent Labs  Lab 06/17/22 1730 06/18/22 0424  PROT 6.0* 4.8*  ALBUMIN 1.9* 1.5*  AST 185* 145*  ALT 48* 40  ALKPHOS 190* 147*  BILITOT 12.7* 10.6*    Hematology Recent Labs  Lab 06/17/22 1730 06/18/22 0424  WBC 18.0* 15.4*  RBC 4.33 3.67*  HGB 14.9 12.5  HCT 40.4 35.2*  MCV 93.3 95.9  MCH 34.4* 34.1*  MCHC 36.9* 35.5  RDW 13.8 13.8  PLT 171 131*    Radiology/Studies:  CT Abdomen Pelvis W Contrast  Result Date: 06/17/2022 CLINICAL DATA:  Pancreatitis.  Jaundice.  Altered mental status. EXAM: CT ABDOMEN AND PELVIS WITH CONTRAST TECHNIQUE: Multidetector CT imaging of the abdomen and pelvis was performed using the standard protocol following bolus administration of intravenous contrast. RADIATION DOSE REDUCTION: This exam was performed according to the departmental dose-optimization program which includes automated exposure control, adjustment of the mA and/or kV according to patient size and/or use of iterative reconstruction technique. CONTRAST:  16m OMNIPAQUE IOHEXOL 350 MG/ML SOLN COMPARISON:  None Available. FINDINGS: Lower chest: There is a small amount of patchy atelectasis and airspace disease in the right lung base. Hepatobiliary: Gallstones are present. There is no biliary ductal dilatation. Liver is diffusely heterogeneous. Can not exclude subtle focal rounded lesion in the left lobe of the liver measuring 3.1 cm image 3/28. This may represent focal fat in the gallbladder fossa. Pancreas: Unremarkable. No pancreatic ductal dilatation or surrounding inflammatory changes. Spleen: Normal in size without focal abnormality. Adrenals/Urinary Tract: Adrenal glands are unremarkable. Kidneys are normal, without renal calculi, focal  lesion, or hydronephrosis. Bladder is unremarkable. Stomach/Bowel: There is a small hiatal hernia. The stomach is otherwise within normal limits. There is no evidence for bowel obstruction, pneumatosis or free air. The appendix is not visualized. Vascular/Lymphatic: Aortic atherosclerosis. No enlarged abdominal or pelvic lymph nodes. 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 Reproductive: Uterine fibroid is present measuring 19 mm. Ovaries are unremarkable. Other: There is moderate ascites throughout the abdomen and pelvis. There is no focal abdominal wall hernia. Musculoskeletal: No acute or significant osseous findings. IMPRESSION: 1. Moderate ascites throughout the abdomen and pelvis. 2. Heterogeneous liver with possible focal lesion in the left lobe of the liver. Correlate clinically with LFTs. Recommend further evaluation with MRI. 3. Retroperitoneal varices. 4. Cholelithiasis. 5. Uterine fibroid. Aortic Atherosclerosis (ICD10-I70.0). Electronically Signed   By: ARonney AstersM.D.   On: 06/17/2022 22:10   CT Head Wo Contrast  Result Date: 06/17/2022 CLINICAL DATA:  Altered mental status. EXAM: CT HEAD WITHOUT CONTRAST TECHNIQUE: Contiguous axial images were obtained from the base of the skull through the vertex without intravenous contrast. RADIATION DOSE REDUCTION: This exam was performed according to the departmental dose-optimization program which includes automated exposure control, adjustment of the mA and/or kV according to patient size and/or use of iterative reconstruction technique. COMPARISON:  None Available. FINDINGS: Brain: There is mild cerebral atrophy with widening of the extra-axial spaces and ventricular dilatation. There are areas of decreased attenuation within the white matter tracts of the supratentorial brain, consistent with microvascular disease changes. Vascular: No hyperdense vessel or unexpected calcification. Skull: Normal. Negative for fracture or focal lesion. Sinuses/Orbits: No  acute finding. Other: None. IMPRESSION: 1. No acute intracranial abnormality. 2. Cerebral atrophy and microvascular disease changes of the supratentorial brain. Electronically Signed   By: TVirgina NorfolkM.D.   On: 06/17/2022 19:13  Assessment and Plan:   Elevated Troponin Patient was admitted for acute hepatic failure after presenting with altered mental status and new jaundice. Work-up revealed elevated troponin. High-sensitivity troponin elevated at  303 >> 206 >> 172. EKG showed non-specific ST/T changes.  - No chest pain. - Echo pending.  - Troponin elevation not consistent with ACS. Suspect demand ischemic secondary to acute hepatic failure. Will wait for Echo results but do no anticipate need for any ischemic evaluation.  Hypertension BP soft but stable. - Home Amlodipine and HCTZ on hold. Continue to hold.  Hyperlipidemia - Home Pravastatin on hold given acute hepatic failure.  Prolonged QTc QTc 610 ms on admission and 606 ms on repeat EKG today. Potassium 2.0 on admission and 2.5 today. Magnesium OK. - Continue to hold home HCTZ. - Continue to replete potassium per primary team. - Can repeat EKG in the morning.    CKD Stage IIIa vs AKI Creatinine 1.25 >> 1.24. Unknown baseline - last labs in 2015 showed creatinine of 0.84.  - Continue to hold home HCTZ. - Continue to monitor.   Acute Hepatic Failure Total Bili 12.7 >> 10.6. Ammonia elevated at 57. Albumin 1.5.  INR 1.7. Abdominal/ pelvic CT showed heterogeneous liver with possible focal lesion in the left lobe of the liver, moderate ascites throughout the abdomen and pelvis, and retroperitoneal varices. Felt to likely be due to ETOH cirrhosis. - Management per GI.  Otherwise, per primary team: - Alcohol abuse - Hyponatremia - GERD - Leukocytosis - History of seizure disorder For questions or updates, please contact [place] Please consult [email or web address] for contact info under  Signed, Darreld Mclean, PA-C  06/18/2022 12:22 PM As above, patient seen and examined.  Briefly she is a 62 year old female with past medical history of hypertension, hyperlipidemia, alcohol abuse admitted with cirrhosis for evaluation of elevated troponin.  No prior cardiac history.  She denies dyspnea, chest pain, palpitations, syncope, pedal edema.  She developed jaundice January 1.  Prior to then she was consuming 2-4 bottles of wine daily.  She has been diagnosed with new onset cirrhosis.  Troponins were checked for unclear reasons.  Minimally elevated and cardiology asked to evaluate. On exam patient is noted to have significant ascites.  Also noted to have scleral icterus.  Electrocardiogram shows sinus rhythm, markedly prolonged QT interval.  Abdominal CT shows ascites, question liver lesion and retroperitoneal varices.  Initial blood work showed sodium 127, potassium 2.0, BUN 22, creatinine 1.25, alkaline phosphatase 190, AST 185, ALT 48, ammonia level 57, bilirubin 12.7, white blood cell count 18, albumin 1.9, INR 1.7.  Troponin is 303, 206 and 172.  1 elevated troponin-patient has no cardiac symptoms.  She denies dyspnea, chest pain, palpitations.  Her troponin is minimally elevated and not consistent with acute coronary syndrome.  An echocardiogram has been ordered by primary care.  If normal no plans for further cardiac evaluation.  2 prolonged QT interval-QTc markedly prolonged at time of admission.  Likely secondary to hypokalemia.  Would supplement and repeat.  3 hypertension-patient's blood pressure has been borderline.  Would not resume amlodipine or HCTZ.  Follow blood pressure and adjust medications as needed.  4 newly diagnosed cirrhosis-management per gastroenterology.  5 alcohol abuse-patient needs cessation.  Other issues per primary care.  Cardiology will sign off.  Please call with questions. Kirk Ruths

## 2022-06-18 NOTE — Progress Notes (Addendum)
PROGRESS NOTE    Kim Holland  EXB:[id number] DOB: March 01, 1961 DOA: 06/17/2022 PCP: Thressa Sheller, MD (Inactive)   Brief Narrative: 62 year old with past medical history significant for alcohol abuse, GERD, hypertension, hyperlipidemia, seizure disorder not on medication, anxiety disorder who presents with confusion, Jaundice, acute liver failure.  She reports she stopped drinking alcohol 3 weeks prior to admission.  She has been drinking heavily for the last year.   Assessment & Plan:   Principal Problem:   Acute hepatic failure Active Problems:   Essential hypertension   Seizure (HCC)   GERD (gastroesophageal reflux disease)   Hyperlipidemia   Hyponatremia   Hypokalemia   Leucocytosis   Alcohol abuse   AKI (acute kidney injury) ([place])   1-Acute Liver Failure: Likely in the setting of alcohol use disorder -Patient reports stopped drinking alcohol since January 2, 3 weeks ago. -CT abdomen and pelvis: Moderate ascites throughout the abdomen and pelvis. Heterogeneous liver with possible focal lesion in the left lobe of the liver.  Recommend further evaluation with MRI. Retroperitoneal varices. Cholelithiasis. -GI has been consulted -Maddrey's discrimination score more than 32.  Will discuss with GI initiation of glucocorticoids. GI recommend rule out infection, prior initiation steroids. UA and blood culture order.  -Underwent paracentesis 1/25  2-Alcohol Abuse Disorder: Monitor on CIWA.  Thiamine, folic acid.   3-Hypokalemia:  Replete IV and oral.  Repeat labs this afternoon.  Mg level 2.6  4-Hyponatremia:  In setting liver failure, component hypovolemia.  Change fluids to NS  Heterogeneous liver with possible focal lesion in the left lobe of the liver. Will get MRI liver.   Acute Hepatic Encephalopathy;  Ammonia: 56 Started lactulose.   Elevation Troponin: Check EKG.  Denies chest pain.  Cardiology consulted.   GERD:  Start PPI.   History of Seizure  Disorder:  Monitor. Not on medications.   HTN; Hold BP medications.   AKI: continue with IV fluids.    Leukocytosis: Work up for infection in process.  Check chest x ray      Estimated body mass index is 27.7 kg/m as calculated from the following:   Height as of this encounter: '5\' 5"'$  (1.651 m).   Weight as of this encounter: 75.5 kg.   DVT prophylaxis: SCD for now due to thrombocytopenia.   Code Status: Full code Family Communication: care discussed with patient.  Disposition Plan:  Status is: Inpatient Remains inpatient appropriate because: management liver failure    Consultants:  GI Cardiology   Procedures:  Paracentesis 1/25 ECHO  Antimicrobials:    Subjective: She is alert, still feels weak, tired. Denies chest pain. Had small BM yesterday.   Objective: Vitals:   06/18/22 0345 06/18/22 0430 06/18/22 0538 06/18/22 0540  BP: (!) 102/59 (!) 106/54  (!) 106/57  Pulse: 80 80  81  Resp: 14   19  Temp:    97.7 F (36.5 C)  TempSrc:    Oral  SpO2: 93% 94%  93%  Weight:   75.5 kg   Height:   '5\' 5"'$  (1.651 m)     Intake/Output Summary (Last 24 hours) at 06/18/2022 0740 Last data filed at 06/18/2022 0600 Gross per 24 hour  Intake 672.81 ml  Output 0 ml  Net 672.81 ml   Filed Weights   06/17/22 1640 06/18/22 0538  Weight: 72.6 kg 75.5 kg    Examination:  General exam: Appears calm and comfortable  Respiratory system: Clear to auscultation. Respiratory effort normal. Cardiovascular system: S1 & S2 heard,  RRR. Gastrointestinal system: Abdomen is distended, soft and nontender.  Central nervous system: Alert and oriented.  Extremities: Symmetric 5 x 5 power.    Data Reviewed: I have personally reviewed following labs and imaging studies  CBC: Recent Labs  Lab 06/17/22 1730 06/18/22 0424  WBC 18.0* 15.4*  HGB 14.9 12.5  HCT 40.4 35.2*  MCV 93.3 95.9  PLT 171 973*   Basic Metabolic Panel: Recent Labs  Lab 06/17/22 1730 06/17/22 2056  06/18/22 0424  NA 127*  --  126*  K 2.0*  --  2.5*  CL 76*  --  83*  CO2 34*  --  32  GLUCOSE 104*  --  130*  BUN 22*  --  24*  CREATININE 1.25*  --  1.24*  CALCIUM 7.8*  --  7.2*  MG  --  2.6*  --    GFR: Estimated Creatinine Clearance: 49.1 mL/min (A) (by C-G formula based on SCr of 1.24 mg/dL (H)). Liver Function Tests: Recent Labs  Lab 06/17/22 1730 06/18/22 0424  AST 185* 145*  ALT 48* 40  ALKPHOS 190* 147*  BILITOT 12.7* 10.6*  PROT 6.0* 4.8*  ALBUMIN 1.9* 1.5*   Recent Labs  Lab 06/17/22 2056  LIPASE 55*   Recent Labs  Lab 06/17/22 1749  AMMONIA 57*   Coagulation Profile: Recent Labs  Lab 06/17/22 1750  INR 1.7*   Cardiac Enzymes: No results for input(s): "CKTOTAL", "CKMB", "CKMBINDEX", "TROPONINI" in the last 168 hours. BNP (last 3 results) No results for input(s): "PROBNP" in the last 8760 hours. HbA1C: No results for input(s): "HGBA1C" in the last 72 hours. CBG: Recent Labs  Lab 06/17/22 1726  GLUCAP 120*   Lipid Profile: No results for input(s): "CHOL", "HDL", "LDLCALC", "TRIG", "CHOLHDL", "LDLDIRECT" in the last 72 hours. Thyroid Function Tests: No results for input(s): "TSH", "T4TOTAL", "FREET4", "T3FREE", "THYROIDAB" in the last 72 hours. Anemia Panel: No results for input(s): "VITAMINB12", "FOLATE", "FERRITIN", "TIBC", "IRON", "RETICCTPCT" in the last 72 hours. Sepsis Labs: No results for input(s): "PROCALCITON", "LATICACIDVEN" in the last 168 hours.  No results found for this or any previous visit (from the past 240 hour(s)).       Radiology Studies: CT Abdomen Pelvis W Contrast  Result Date: 06/17/2022 CLINICAL DATA:  Pancreatitis.  Jaundice.  Altered mental status. EXAM: CT ABDOMEN AND PELVIS WITH CONTRAST TECHNIQUE: Multidetector CT imaging of the abdomen and pelvis was performed using the standard protocol following bolus administration of intravenous contrast. RADIATION DOSE REDUCTION: This exam was performed according to  the departmental dose-optimization program which includes automated exposure control, adjustment of the mA and/or kV according to patient size and/or use of iterative reconstruction technique. CONTRAST:  66m OMNIPAQUE IOHEXOL 350 MG/ML SOLN COMPARISON:  None Available. FINDINGS: Lower chest: There is a small amount of patchy atelectasis and airspace disease in the right lung base. Hepatobiliary: Gallstones are present. There is no biliary ductal dilatation. Liver is diffusely heterogeneous. Can not exclude subtle focal rounded lesion in the left lobe of the liver measuring 3.1 cm image 3/28. This may represent focal fat in the gallbladder fossa. Pancreas: Unremarkable. No pancreatic ductal dilatation or surrounding inflammatory changes. Spleen: Normal in size without focal abnormality. Adrenals/Urinary Tract: Adrenal glands are unremarkable. Kidneys are normal, without renal calculi, focal lesion, or hydronephrosis. Bladder is unremarkable. Stomach/Bowel: There is a small hiatal hernia. The stomach is otherwise within normal limits. There is no evidence for bowel obstruction, pneumatosis or free air. The appendix is not visualized. Vascular/Lymphatic:  Aortic atherosclerosis. No enlarged abdominal or pelvic lymph nodes. 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 Reproductive: Uterine fibroid is present measuring 19 mm. Ovaries are unremarkable. Other: There is moderate ascites throughout the abdomen and pelvis. There is no focal abdominal wall hernia. Musculoskeletal: No acute or significant osseous findings. IMPRESSION: 1. Moderate ascites throughout the abdomen and pelvis. 2. Heterogeneous liver with possible focal lesion in the left lobe of the liver. Correlate clinically with LFTs. Recommend further evaluation with MRI. 3. Retroperitoneal varices. 4. Cholelithiasis. 5. Uterine fibroid. Aortic Atherosclerosis (ICD10-I70.0). Electronically Signed   By: Ronney Asters M.D.   On: 06/17/2022 22:10   CT Head Wo  Contrast  Result Date: 06/17/2022 CLINICAL DATA:  Altered mental status. EXAM: CT HEAD WITHOUT CONTRAST TECHNIQUE: Contiguous axial images were obtained from the base of the skull through the vertex without intravenous contrast. RADIATION DOSE REDUCTION: This exam was performed according to the departmental dose-optimization program which includes automated exposure control, adjustment of the mA and/or kV according to patient size and/or use of iterative reconstruction technique. COMPARISON:  None Available. FINDINGS: Brain: There is mild cerebral atrophy with widening of the extra-axial spaces and ventricular dilatation. There are areas of decreased attenuation within the white matter tracts of the supratentorial brain, consistent with microvascular disease changes. Vascular: No hyperdense vessel or unexpected calcification. Skull: Normal. Negative for fracture or focal lesion. Sinuses/Orbits: No acute finding. Other: None. IMPRESSION: 1. No acute intracranial abnormality. 2. Cerebral atrophy and microvascular disease changes of the supratentorial brain. Electronically Signed   By: Virgina Norfolk M.D.   On: 06/17/2022 19:13        Scheduled Meds:  folic acid  1 mg Oral Daily   multivitamin with minerals  1 tablet Oral Daily   potassium chloride  40 mEq Oral Q4H   thiamine  100 mg Oral Daily   Continuous Infusions:  0.9 % NaCl with KCl 20 mEq / L     potassium chloride 10 mEq (06/18/22 0729)     LOS: 1 day    Time spent: 35 minutes    Mikea Quadros A Lopaka Karge, MD [facility]   If 7PM-7AM, please contact night-coverage [email or web address]  06/18/2022, 7:40 AM

## 2022-06-18 NOTE — ED Notes (Signed)
Patient taken up to inpatient room with nurse in NAD and on tele monitor at this time.

## 2022-06-18 NOTE — Discharge Instructions (Signed)
Outpatient Providers   [facility] ([facility]) Group and individual counseling. [address]  [place], [place] [address] [phone] [place]: [phone]  [place]: [phone] Medicaid and uninsured.   [facility] groups multiple times per week. [place], [place], [place] [address] [phone] Takes Medicaid and other insurances.   [facility] Dependency Intensive Outpatient Program (IOP) [address] [place], [place] [address] [phone] Takes Pharmacist, community and [place].   [facility]  IOP and Partial Hospitalization Program  [place].  [place], [place] [address] [phone] Private Insurance, [place] only for partial hospitalization     [facility] ([place]) IOP, individual counseling, medication management [place], [place] [address] [phone] Medicaid and [facility]  [place] [address]  [place], [place] [address] [phone] Private Insurance and [facility] [address]  [place], [place] [address] [phone] Private Insurance, [place], and Self Pay   [facility]adone Clinic  [place], [place] [address] [facility]  [address]  [place], [place] [address] [phone]  [facility]  [address] [place], [place] [address] [phone]      Residential Treatment Programs  [facility] ([place] [place], [place] [address] [phone] or [phone] Detox and Residential Rehab 14 days (Medicare, Medicaid, private insurance, and self pay)  [facility] [place], [place] [address] [phone] Detox (self Pay and Medicaid Limited availability) Rehab Only Female (Medicare, [place], and  Self Pay)  [facility] [address] [place], [place] [address] [phone] or [phone] Private Insurance only  [facility]  [place].  [place], [place] [address] [phone] Treatment Only, must make assessment appointment, and must be sober for assessment appointment. Self pay, Medicare A and B, [facility], must be [facility] resident.      Residential Treatment [place] , [place]  [phone] [employer], [place], [place]. They offer assistance with transportation.   [facility] [address] [place],  [place], [place] [address] [phone] [facility] No insurance     [place] [place], [place] [address] [phone] No pending legal charges, Long-term work program  [place]: [facility]  [place], [place] [address] [address], [place], [place] [address] Residential treatment (takes people on Methadone/Suboxone)  Medicaid and uninsured  [facility]  [address], [place], [place] [address] [phone] or [phone] [employer] Insurance Only [place] [phone] Private Insurance Males/Females, call to make referrals, multiple facilities   [facility] [address],  [place], [place] [address]  [phone] Men Only Upfront Fee [facility] [address]      [facility]: [facility] [place], [place] [address] [phone]  [place], [place] [address] [phone] (f)  [place],  [address] [phone] (f)       Syringe Services Program Due to COVID-19, syringe services programs are likely operating under different hours with  limited  or no fixed site hours. Some programs may not be operating at all. Please contact the program directly using the phone numbers provided below to see if they are still operating under COVID-19.  [facility] Solution to the Opioid Problem (GCSTOP) Fixed; mobile; peer-based [employer] [phone] [email or web address] Fixed site exchange at [facility], [place]. [place], [place] [address] on Wednesdays (2:00 - 5:00 pm) and Thursdays (4:00 - 8:00 pm). Pop-up mobile exchange locations: [employer] and [employer], [address] [place], [place] [address] on Tuesdays (11:00 am - 1:00 pm) and Fridays (11:00 am - 1:00 pm) [address], [place], [place] [address] on Tuesdays (2:00 - 4:00 pm) and Fridays (2:00 - 4:00 pm) [facility] - also serves [place] and [employer] Fixed; mobile; peer-based Rosemary Holms [phone] [email or web address] [address] [place],  [address] Delivery and outreach available in [place] and [place], please call for more information. Monday, Tuesday: 1:00 -7:00 pm Thursday: 4:00 pm - 8:00 pm Friday: 1:00 pm - 8:00 pm)

## 2022-06-19 ENCOUNTER — Inpatient Hospital Stay (HOSPITAL_COMMUNITY): Payer: No Typology Code available for payment source

## 2022-06-19 ENCOUNTER — Telehealth: Payer: Self-pay

## 2022-06-19 DIAGNOSIS — I10 Essential (primary) hypertension: Secondary | ICD-10-CM | POA: Diagnosis not present

## 2022-06-19 DIAGNOSIS — K7200 Acute and subacute hepatic failure without coma: Secondary | ICD-10-CM | POA: Diagnosis not present

## 2022-06-19 DIAGNOSIS — K7011 Alcoholic hepatitis with ascites: Secondary | ICD-10-CM | POA: Diagnosis not present

## 2022-06-19 DIAGNOSIS — R7989 Other specified abnormal findings of blood chemistry: Secondary | ICD-10-CM

## 2022-06-19 DIAGNOSIS — F1010 Alcohol abuse, uncomplicated: Secondary | ICD-10-CM

## 2022-06-19 DIAGNOSIS — K7031 Alcoholic cirrhosis of liver with ascites: Secondary | ICD-10-CM | POA: Diagnosis not present

## 2022-06-19 DIAGNOSIS — E876 Hypokalemia: Secondary | ICD-10-CM

## 2022-06-19 LAB — ECHOCARDIOGRAM COMPLETE
AR max vel: 2.41 cm2
AV Area VTI: 2.24 cm2
AV Area mean vel: 2.12 cm2
AV Mean grad: 8 mmHg
AV Peak grad: 12.4 mmHg
Ao pk vel: 1.76 m/s
Area-P 1/2: 2.82 cm2
Calc EF: 66.6 %
Height: 65 in
MV VTI: 2.49 cm2
S' Lateral: 2.9 cm
Single Plane A2C EF: 70.5 %
Single Plane A4C EF: 61.9 %
Weight: 2663.16 oz

## 2022-06-19 LAB — CBC
HCT: 36.6 % (ref 36.0–46.0)
Hemoglobin: 13.4 g/dL (ref 12.0–15.0)
MCH: 34.4 pg — ABNORMAL HIGH (ref 26.0–34.0)
MCHC: 36.6 g/dL — ABNORMAL HIGH (ref 30.0–36.0)
MCV: 93.8 fL (ref 80.0–100.0)
Platelets: 135 10*3/uL — ABNORMAL LOW (ref 150–400)
RBC: 3.9 MIL/uL (ref 3.87–5.11)
RDW: 14.1 % (ref 11.5–15.5)
WBC: 18.7 10*3/uL — ABNORMAL HIGH (ref 4.0–10.5)
nRBC: 0 % (ref 0.0–0.2)

## 2022-06-19 LAB — HEPATIC FUNCTION PANEL
ALT: 48 U/L — ABNORMAL HIGH (ref 0–44)
AST: 165 U/L — ABNORMAL HIGH (ref 15–41)
Albumin: 1.7 g/dL — ABNORMAL LOW (ref 3.5–5.0)
Alkaline Phosphatase: 173 U/L — ABNORMAL HIGH (ref 38–126)
Bilirubin, Direct: 5.7 mg/dL — ABNORMAL HIGH (ref 0.0–0.2)
Indirect Bilirubin: 4.8 mg/dL — ABNORMAL HIGH (ref 0.3–0.9)
Total Bilirubin: 10.5 mg/dL — ABNORMAL HIGH (ref 0.3–1.2)
Total Protein: 5.3 g/dL — ABNORMAL LOW (ref 6.5–8.1)

## 2022-06-19 LAB — BASIC METABOLIC PANEL
Anion gap: 11 (ref 5–15)
BUN: 23 mg/dL — ABNORMAL HIGH (ref 6–20)
CO2: 28 mmol/L (ref 22–32)
Calcium: 7.8 mg/dL — ABNORMAL LOW (ref 8.9–10.3)
Chloride: 90 mmol/L — ABNORMAL LOW (ref 98–111)
Creatinine, Ser: 1.12 mg/dL — ABNORMAL HIGH (ref 0.44–1.00)
GFR, Estimated: 56 mL/min — ABNORMAL LOW (ref >60)
Glucose, Bld: 142 mg/dL — ABNORMAL HIGH (ref 70–99)
Potassium: 3.8 mmol/L (ref 3.5–5.1)
Sodium: 129 mmol/L — ABNORMAL LOW (ref 135–145)

## 2022-06-19 LAB — MAGNESIUM: Magnesium: 2.5 mg/dL — ABNORMAL HIGH (ref 1.7–2.4)

## 2022-06-19 MED ORDER — PERFLUTREN LIPID MICROSPHERE
1.0000 mL | INTRAVENOUS | Status: AC | PRN
  Administered 2022-06-19: 2 mL via INTRAVENOUS

## 2022-06-19 MED ORDER — SODIUM CHLORIDE 0.9 % IV SOLN
1.0000 g | Freq: Every day | INTRAVENOUS | Status: DC
  Administered 2022-06-19: 1 g via INTRAVENOUS
  Filled 2022-06-19 (×2): qty 10

## 2022-06-19 MED ORDER — GADOBUTROL 1 MMOL/ML IV SOLN
7.5000 mL | Freq: Once | INTRAVENOUS | Status: AC | PRN
  Administered 2022-06-19: 7.5 mL via INTRAVENOUS

## 2022-06-19 MED ORDER — LORAZEPAM 1 MG PO TABS
1.0000 mg | ORAL_TABLET | Freq: Once | ORAL | Status: AC | PRN
  Administered 2022-06-19: 1 mg via ORAL
  Filled 2022-06-19: qty 1

## 2022-06-19 MED ORDER — SODIUM CHLORIDE 0.9 % IV SOLN
500.0000 mg | Freq: Every day | INTRAVENOUS | Status: AC
  Administered 2022-06-19 – 2022-06-23 (×5): 500 mg via INTRAVENOUS
  Filled 2022-06-19 (×5): qty 5

## 2022-06-19 NOTE — Telephone Encounter (Signed)
Referral entered and all records we have faxed.

## 2022-06-19 NOTE — Telephone Encounter (Signed)
-----  Message from Loralie Champagne, PA-C sent at 06/19/2022 10:41 AM EST ----- Can you please place a referral to [facility] for ETOH hepatitis/liver disease?  We saw her in the hospital.  Thank you,  Jess

## 2022-06-19 NOTE — Progress Notes (Signed)
  Echocardiogram 2D Echocardiogram has been performed.  Kim Holland 06/19/2022, 9:07 AM

## 2022-06-19 NOTE — Progress Notes (Signed)
[facility] ROUNDING NOTE   Subjective: Patient was somnolent, denies any abdominal pain   Objective: Vital signs in last 24 hours: Temp:  [97.9 F (36.6 C)-98.4 F (36.9 C)] 97.9 F (36.6 C) (01/26 1029) Pulse Rate:  [85-89] 85 (01/26 1029) Resp:  [18] 18 (01/26 1029) BP: (102-115)/(59-79) 102/59 (01/26 1029) SpO2:  [90 %-99 %] 99 % (01/26 1029) Last BM Date : 06/19/22 General: NAD Abdomen: Soft, distended, no tenderness     Intake/Output from previous day: 01/25 0701 - 01/26 0700 In: 1842.5 [P.O.:420; I.V.:1272.9; IV Piggyback:149.5] Out: 175 [Urine:175] Intake/Output this shift: Total I/O In: 1016.1 [P.O.:120; I.V.:546.1; IV Piggyback:350] Out: 150 [Urine:150]   Lab Results: Recent Labs    06/17/22 1730 06/18/22 0424 06/19/22 0958  WBC 18.0* 15.4* 18.7*  HGB 14.9 12.5 13.4  PLT 171 131* 135*  MCV 93.3 95.9 93.8   BMET Recent Labs    06/18/22 0424 06/18/22 1045 06/19/22 0958  NA 126* 127* 129*  K 2.5* 2.8* 3.8  CL 83* 81* 90*  CO2 32 31 28  GLUCOSE 130* 129* 142*  BUN 24* 25* 23*  CREATININE 1.24* 1.20* 1.12*  CALCIUM 7.2* 7.7* 7.8*   LFT Recent Labs    06/17/22 1730 06/18/22 0424 06/19/22 0958  PROT 6.0* 4.8* 5.3*  ALBUMIN 1.9* 1.5* 1.7*  AST 185* 145* 165*  ALT 48* 40 48*  ALKPHOS 190* 147* 173*  BILITOT 12.7* 10.6* 10.5*  BILIDIR  --   --  5.7*  IBILI  --   --  4.8*   PT/INR Recent Labs    06/17/22 1750  INR 1.7*      Imaging/Other results: MR ABDOMEN W WO CONTRAST  Result Date: 06/19/2022 CLINICAL DATA:  Pancreatitis.  Jaundice.  New liver lesion. EXAM: MRI ABDOMEN WITHOUT AND WITH CONTRAST TECHNIQUE: Multiplanar multisequence MR imaging of the abdomen was performed both before and after the administration of intravenous contrast. CONTRAST:  7.70m GADAVIST GADOBUTROL 1 MMOL/ML IV SOLN COMPARISON:  CT 06/17/2022 FINDINGS: Lower chest: Trace pleural fluid. There is some bandlike areas of abnormal signal along the  lung bases. Increased from the prior exam. Atelectasis versus infiltrate. Hepatobiliary: There is a nodular heterogeneous appearing liver consistent with chronic liver disease. Areas of signal dropout as well consistent with components of fatty liver infiltration. Liver is micronodular. There are some areas of interstitial enhancement on the most delayed consistent with components of some fibrosis. Specifically in segment 4B on the prior CT scan with some low-density which is ill-defined. This is again seen today involving segment 4B. On series 902, image 63 this measures 3.4 by 2.9 cm and cephalocaudal on series 10, image 68 3.2 cm. This abuts the gallbladder fossa. Areas somewhat low in signal on T2. On precontrast T1 slightly bright in signal compared to adjacent liver. The area is not show arterial enhancement but more slow progressive enhancement on the dynamic. This could represent an area fatty sparing rather than a true intrinsic lesion with the lack of arterial enhancement or washout. No true hypervascular lesions identified in the liver at this time No biliary ductal dilatation. The gallbladder however is dilated with stones. In addition the gallbladder wall is thickened and irregular. Patchy enhancement. Please correlate for developing acute cholecystitis. Pancreas: Pancreas again shows areas of interstitial edema. No pancreatic dilatation. Preserved pancreatic parenchymal enhancement. Spleen: Spleen is nonenlarged. Preserved enhancement and T2 signal. Adrenals/Urinary Tract: Adrenal glands are preserved. No enhancing renal mass or collecting system dilatation. Lobular contour to the right kidney.  Stomach/Bowel: Stomach is collapsed. The visualized small large bowel is nondilated. There are some subtle areas of wall thickening particularly along the right side of the colon. Nonspecific in the presence of ascites and chronic liver disease. Vascular/Lymphatic: Normal caliber aorta and IVC. There is some  prominent vessels in the retroperitoneum with some varices in the left upper quadrant. The main portal vein is patent but small. Diameter at the liver hilum approaches 8 mm in diameter. Other: Mild scattered ascites. Mesenteric stranding. Anasarca. Diffuse motion artifact Musculoskeletal: Curvature of the spine with scattered degenerative changes. IMPRESSION: Dilated gallbladder with wall thickening and irregular gallbladder wall with stones. Please correlate for clinical evidence of acute cholecystitis. If needed a HIDA scan. No ductal dilatation. Changes once again of edema throughout the pancreas. Please correlate for any clinical evidence of pancreatitis. No well-defined fluid collections. Preserved pancreatic enhancement. Evidence of chronic liver disease with the micronodular liver. Scattered ascites. No splenomegaly. There are varices but the main portal vein is patent. Main portal vein is small in caliber. Focal abnormality along the liver in segment 4 B corresponding to the finding by CT scan does not have aggressive features. No hypervascularity, washout or abnormal diffusion. This could represent an area of fatty sparing as there is some underlying diffuse fatty liver infiltration noted. Liver rads category 3. Increasing lung base parenchymal changes. Atelectasis versus infiltrate. Recommend follow-up [place] Electronically Signed   By: Jill Side M.D.   On: 06/19/2022 15:36   ECHOCARDIOGRAM COMPLETE  Result Date: 06/19/2022    ECHOCARDIOGRAM REPORT   Patient Name:   Kim Holland Date of Exam: 06/19/2022 Medical Rec #:  [id number]        Height:       65.0 in Accession #:    [id number]       Weight:       166.4 lb Date of Birth:  02-11-1961         BSA:          1.830 m Patient Age:    62 years         BP:           104/79 mmHg Patient Gender: F                HR:           87 bpm. Exam Location:  Inpatient Procedure: 2D Echo, Color Doppler, Cardiac Doppler and Intracardiac            Opacification  Agent Indications:    Elevated troponin  History:        Patient has no prior history of Echocardiogram examinations.                 Risk Factors:Hypertension and Dyslipidemia.  Sonographer:    Eartha Inch Referring Phys: [id number] BELKYS A REGALADO  Sonographer Comments: Technically difficult study due to poor echo windows. Image acquisition challenging due to patient body habitus and Image acquisition challenging due to respiratory motion. IMPRESSIONS  1. Left ventricular ejection fraction, by estimation, is 60 to 65%. Left ventricular ejection fraction by 2D MOD biplane is 66.6 %. The left ventricle has normal function. The left ventricle has no regional wall motion abnormalities. Left ventricular diastolic parameters were normal.  2. Right ventricular systolic function is mildly reduced. The right ventricular size is mildly enlarged. Tricuspid regurgitation signal is inadequate for assessing PA pressure.  3. The mitral valve was not well visualized. No evidence of mitral valve regurgitation. No  evidence of mitral stenosis.  4. The aortic valve is tricuspid. Aortic valve regurgitation is not visualized. No aortic stenosis is present.  5. The inferior vena cava is normal in size with greater than 50% respiratory variability, suggesting right atrial pressure of 3 mmHg. FINDINGS  Left Ventricle: Left ventricular ejection fraction, by estimation, is 60 to 65%. Left ventricular ejection fraction by 2D MOD biplane is 66.6 %. The left ventricle has normal function. The left ventricle has no regional wall motion abnormalities. Definity contrast agent was given IV to delineate the left ventricular endocardial borders. The left ventricular internal cavity size was normal in size. There is no left ventricular hypertrophy. Left ventricular diastolic parameters were normal. Right Ventricle: The right ventricular size is mildly enlarged. No increase in right ventricular wall thickness. Right ventricular systolic function is  mildly reduced. Tricuspid regurgitation signal is inadequate for assessing PA pressure. The tricuspid regurgitant velocity is 1.67 m/s, and with an assumed right atrial pressure of 3 mmHg, the estimated right ventricular systolic pressure is 75.9 mmHg. Left [facility]: Left atrial size was normal in size. Right [facility]: Right atrial size was normal in size. Pericardium: There is no evidence of pericardial effusion. Mitral Valve: The mitral valve was not well visualized. No evidence of mitral valve regurgitation. No evidence of mitral valve stenosis. MV peak gradient, 4.8 mmHg. The mean mitral valve gradient is 2.0 mmHg. Tricuspid Valve: The tricuspid valve is normal in structure. Tricuspid valve regurgitation is trivial. No evidence of tricuspid stenosis. Aortic Valve: The aortic valve is tricuspid. Aortic valve regurgitation is not visualized. No aortic stenosis is present. Aortic valve mean gradient measures 8.0 mmHg. Aortic valve peak gradient measures 12.4 mmHg. Aortic valve area, by VTI measures 2.24  cm. Pulmonic Valve: The pulmonic valve was not well visualized. Pulmonic valve regurgitation is not visualized. No evidence of pulmonic stenosis. Aorta: The aortic root and ascending aorta are structurally normal, with no evidence of dilitation. Venous: The inferior vena cava was not well visualized. The inferior vena cava is normal in size with greater than 50% respiratory variability, suggesting right atrial pressure of 3 mmHg. IAS/Shunts: No atrial level shunt detected by color flow Doppler.  LEFT VENTRICLE PLAX 2D                        Biplane EF (MOD) LVIDd:         4.60 cm         LV Biplane EF:   Left LVIDs:         2.90 cm                          ventricular LV PW:         0.90 cm                          ejection LV IVS:        0.90 cm                          fraction by LVOT diam:     2.00 cm                          2D MOD LV SV:         73  biplane is LV SV Index:   40                                66.6 %. LVOT Area:     3.14 cm                                Diastology                                LV e' medial:    6.85 cm/s LV Volumes (MOD)               LV E/e' medial:  9.3 LV vol d, MOD    41.4 ml       LV e' lateral:   9.90 cm/s A2C:                           LV E/e' lateral: 6.4 LV vol d, MOD    69.6 ml A4C: LV vol s, MOD    12.2 ml A2C: LV vol s, MOD    26.5 ml A4C: LV SV MOD A2C:   29.2 ml LV SV MOD A4C:   69.6 ml LV SV MOD BP:    35.8 ml RIGHT VENTRICLE RV S prime:     15.50 cm/s TAPSE (M-mode): 2.7 cm LEFT ATRIUM             Index        RIGHT ATRIUM           Index LA diam:        3.40 cm 1.86 cm/m   RA Area:     10.70 cm LA Vol (A2C):   35.2 ml 19.24 ml/m  RA Volume:   20.90 ml  11.42 ml/m LA Vol (A4C):   33.7 ml 18.42 ml/m LA Biplane Vol: 34.9 ml 19.08 ml/m  AORTIC VALVE AV Area (Vmax):    2.41 cm AV Area (Vmean):   2.12 cm AV Area (VTI):     2.24 cm AV Vmax:           176.00 cm/s AV Vmean:          135.000 cm/s AV VTI:            0.327 m AV Peak Grad:      12.4 mmHg AV Mean Grad:      8.0 mmHg LVOT Vmax:         135.00 cm/s LVOT Vmean:        91.200 cm/s LVOT VTI:          0.233 m LVOT/AV VTI ratio: 0.71  AORTA Ao Root diam: 3.00 cm Ao Asc diam:  3.10 cm MITRAL VALVE               TRICUSPID VALVE MV Area (PHT): 2.82 cm    TR Peak grad:   11.2 mmHg MV Area VTI:   2.49 cm    TR Vmax:        167.00 cm/s MV Peak grad:  4.8 mmHg MV Mean grad:  2.0 mmHg    SHUNTS MV Vmax:       1.10 m/s    Systemic VTI:  0.23 m MV Vmean:      71.7 cm/s   Systemic  Diam: 2.00 cm MV Decel Time: 269 msec MV E velocity: 63.40 cm/s MV A velocity: 91.30 cm/s MV E/A ratio:  0.69 Jenkins Rouge MD Electronically signed by Jenkins Rouge MD Signature Date/Time: 06/19/2022/9:58:47 AM    Final    IR Paracentesis  Result Date: 06/19/2022 INDICATION: Patient with history of alcohol abuse presents with abdominal distension, previous imaging showed ascites. Request for therapeutic and diagnostic  paracentesis. EXAM: ULTRASOUND GUIDED PARACENTESIS MEDICATIONS: 10 mL 1% lidocaine COMPLICATIONS: None immediate. PROCEDURE: Informed written consent was obtained from the patient after a discussion of the risks, benefits and alternatives to treatment. A timeout was performed prior to the initiation of the procedure. Initial ultrasound scanning demonstrates a small amount of ascites within the right lower abdominal quadrant. The right lower abdomen was prepped and draped in the usual sterile fashion. 1% lidocaine was used for local anesthesia. Following this, a 19 gauge, 7-cm, Yueh catheter was introduced. An ultrasound image was saved for documentation purposes. The paracentesis was performed. The catheter was removed and a dressing was applied. The patient tolerated the procedure well without immediate post procedural complication. FINDINGS: A total of approximately 1 L of hazy yellow fluid was removed. Samples were sent to the laboratory as requested by the clinical team. IMPRESSION: Successful ultrasound-guided paracentesis yielding 1 liter of peritoneal fluid. PLAN: If the patient eventually requires >/=2 paracenteses in a 30 day period, candidacy for formal evaluation by the [facility] will be assessed. Read by: Durenda Guthrie, PA-C Electronically Signed   By: Sandi Mariscal M.D.   On: 06/19/2022 08:21   DG Chest 2 View  Result Date: 06/18/2022 CLINICAL DATA:  Leukocytosis EXAM: CHEST - 2 VIEW COMPARISON:  None Available. FINDINGS: Normal heart size, mediastinal contours, and pulmonary vascularity. Atelectasis versus infiltrate at RIGHT base. Remaining lungs clear. No acute infiltrate, pleural effusion, or pneumothorax. Bones demineralized. IMPRESSION: Atelectasis versus infiltrate at RIGHT base. Electronically Signed   By: Lavonia Dana M.D.   On: 06/18/2022 17:10   CT Abdomen Pelvis W Contrast  Result Date: 06/17/2022 CLINICAL DATA:  Pancreatitis.  Jaundice.   Altered mental status. EXAM: CT ABDOMEN AND PELVIS WITH CONTRAST TECHNIQUE: Multidetector CT imaging of the abdomen and pelvis was performed using the standard protocol following bolus administration of intravenous contrast. RADIATION DOSE REDUCTION: This exam was performed according to the departmental dose-optimization program which includes automated exposure control, adjustment of the mA and/or kV according to patient size and/or use of iterative reconstruction technique. CONTRAST:  48m OMNIPAQUE IOHEXOL 350 MG/ML SOLN COMPARISON:  None Available. FINDINGS: Lower chest: There is a small amount of patchy atelectasis and airspace disease in the right lung base. Hepatobiliary: Gallstones are present. There is no biliary ductal dilatation. Liver is diffusely heterogeneous. Can not exclude subtle focal rounded lesion in the left lobe of the liver measuring 3.1 cm image 3/28. This may represent focal fat in the gallbladder fossa. Pancreas: Unremarkable. No pancreatic ductal dilatation or surrounding inflammatory changes. Spleen: Normal in size without focal abnormality. Adrenals/Urinary Tract: Adrenal glands are unremarkable. Kidneys are normal, without renal calculi, focal lesion, or hydronephrosis. Bladder is unremarkable. Stomach/Bowel: There is a small hiatal hernia. The stomach is otherwise within normal limits. There is no evidence for bowel obstruction, pneumatosis or free air. The appendix is not visualized. Vascular/Lymphatic: Aortic atherosclerosis. No enlarged abdominal or pelvic lymph nodes. 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 Reproductive: Uterine fibroid is present measuring 19 mm. Ovaries are unremarkable. Other: There is moderate ascites throughout the abdomen  and pelvis. There is no focal abdominal wall hernia. Musculoskeletal: No acute or significant osseous findings. IMPRESSION: 1. Moderate ascites throughout the abdomen and pelvis. 2. Heterogeneous liver with possible focal lesion in the left  lobe of the liver. Correlate clinically with LFTs. Recommend further evaluation with MRI. 3. Retroperitoneal varices. 4. Cholelithiasis. 5. Uterine fibroid. Aortic Atherosclerosis (ICD10-I70.0). Electronically Signed   By: Ronney Asters M.D.   On: 06/17/2022 22:10   CT Head Wo Contrast  Result Date: 06/17/2022 CLINICAL DATA:  Altered mental status. EXAM: CT HEAD WITHOUT CONTRAST TECHNIQUE: Contiguous axial images were obtained from the base of the skull through the vertex without intravenous contrast. RADIATION DOSE REDUCTION: This exam was performed according to the departmental dose-optimization program which includes automated exposure control, adjustment of the mA and/or kV according to patient size and/or use of iterative reconstruction technique. COMPARISON:  None Available. FINDINGS: Brain: There is mild cerebral atrophy with widening of the extra-axial spaces and ventricular dilatation. There are areas of decreased attenuation within the white matter tracts of the supratentorial brain, consistent with microvascular disease changes. Vascular: No hyperdense vessel or unexpected calcification. Skull: Normal. Negative for fracture or focal lesion. Sinuses/Orbits: No acute finding. Other: None. IMPRESSION: 1. No acute intracranial abnormality. 2. Cerebral atrophy and microvascular disease changes of the supratentorial brain. Electronically Signed   By: Virgina Norfolk M.D.   On: 06/17/2022 19:13      Assessment &Plan 62 year old female with alcohol abuse, decompensated EtOH cirrhosis with ascites and acute alcoholic hepatitis Discriminant function score >32 Ascites fluid negative for SBP, urine and blood culture pending.  UA negative.  No sign of active infection Started on oral prednisolone 40 mg daily yesterday, will plan to continue for 4 weeks  LFT are slightly trending down, continue to monitor daily LFT, PT and INR   Discussed alcohol cessation Monitor for alcohol withdrawal Low-sodium  diet    K. Denzil Magnuson , MD [phone]  [facility]

## 2022-06-19 NOTE — Progress Notes (Addendum)
PROGRESS NOTE    Kim Holland  WUX:[id number] DOB: 12/16/1960 DOA: 06/17/2022 PCP: Thressa Sheller, MD (Inactive)   Brief Narrative: 62 year old with past medical history significant for alcohol abuse, GERD, hypertension, hyperlipidemia, seizure disorder not on medication, anxiety disorder who presents with confusion, Jaundice, acute liver failure.  She reports she stopped drinking alcohol 3 weeks prior to admission.  She has been drinking heavily for the last year.  Patient admitted with decompensated liver cirrhosis secondary to alcohol use, alcoholic hepatitis.   Assessment & Plan:   Principal Problem:   Acute hepatic failure Active Problems:   Essential hypertension   Seizure (HCC)   GERD (gastroesophageal reflux disease)   Hyperlipidemia   Hyponatremia   Hypokalemia   Leucocytosis   Alcohol abuse   AKI (acute kidney injury) ([place])   Alcoholic cirrhosis of liver with ascites (HCC)   Alcoholic hepatitis with ascites   1-Acute Liver Failure: Likely in the setting of alcohol use disorder -Patient reports stopped drinking alcohol since January 2, 3 weeks ago. -CT abdomen and pelvis: Moderate ascites throughout the abdomen and pelvis. Heterogeneous liver with possible focal lesion in the left lobe of the liver.  Recommend further evaluation with MRI. Retroperitoneal varices. Cholelithiasis. -GI has been consulted, and following, appreciate assistance.  -Maddrey's discrimination score more than 32.  Started on prednisone 1/25. -Underwent paracentesis 1/25, negative for SBP.  Continue with lactulose.   2-Alcohol Abuse Disorder: Monitor on CIWA.  Thiamine, folic acid.   3-Hypokalemia:  Replaced.  Mg level 2.6 K at 3.8  4-Hyponatremia:  In setting liver failure, component hypovolemia.  On IV  NS Improving.   Heterogeneous liver with possible focal lesion in the left lobe of the liver. MRI ordered.   Acute Hepatic Encephalopathy;  Ammonia: 56 Continue with  lactulose.   Elevation Troponin:  Denies chest pain.  Cardiology consulted. No further cardia evaluation.  Demand ischemia.   GERD:  Continue with PPI.   History of Seizure Disorder:  Monitor. Not on medications.   HTN; Hold BP medications.   AKI: Continue with IV fluids.   Leukocytosis: CAP, UTI UA with positive nitrates and leukocytes.  Chest x ray: Atelectasis versus infiltrate at right base Started on IV ceftriaxone and azithromycin WBC higher today, likely in setting of prednisone.     Estimated body mass index is 27.7 kg/m as calculated from the following:   Height as of this encounter: '5\' 5"'$  (1.651 m).   Weight as of this encounter: 75.5 kg.   DVT prophylaxis: SCD for now due to thrombocytopenia.   Code Status: Full code Family Communication: Husband updated 1/26 Disposition Plan:  Status is: Inpatient Remains inpatient appropriate because: management liver failure    Consultants:  GI Cardiology   Procedures:  Paracentesis 1/25 ECHO  Antimicrobials:    Subjective: She denies abdominal pain.  She is still feeling weak tired. She report some discomfort when she urinates.   Objective: Vitals:   06/18/22 0922 06/18/22 1631 06/18/22 2106 06/19/22 0506  BP: (!) 95/52 94/79 115/67 104/79  Pulse: 83 93 89 85  Resp: '18 18 18 18  '$ Temp: 98 F (36.7 C) 98.2 F (36.8 C) 98.2 F (36.8 C) 98.4 F (36.9 C)  TempSrc:  Oral Oral   SpO2: 90% 92% 90% 91%  Weight:      Height:        Intake/Output Summary (Last 24 hours) at 06/19/2022 0655 Last data filed at 06/19/2022 0506 Gross per 24 hour  Intake 1842.45 ml  Output 175 ml  Net 1667.45 ml    Filed Weights   06/17/22 1640 06/18/22 0538  Weight: 72.6 kg 75.5 kg    Examination:  General exam: Chronic ill appearing.  Respiratory system: CTA Cardiovascular system: SS 1, S 2 RRR Gastrointestinal system: BS present, soft, nt Central nervous system: Alert, oriented.  Extremities: no  edema    Data Reviewed: I have personally reviewed following labs and imaging studies  CBC: Recent Labs  Lab 06/17/22 1730 06/18/22 0424  WBC 18.0* 15.4*  HGB 14.9 12.5  HCT 40.4 35.2*  MCV 93.3 95.9  PLT 171 131*    Basic Metabolic Panel: Recent Labs  Lab 06/17/22 1730 06/17/22 2056 06/18/22 0424 06/18/22 1045  NA 127*  --  126* 127*  K 2.0*  --  2.5* 2.8*  CL 76*  --  83* 81*  CO2 34*  --  32 31  GLUCOSE 104*  --  130* 129*  BUN 22*  --  24* 25*  CREATININE 1.25*  --  1.24* 1.20*  CALCIUM 7.8*  --  7.2* 7.7*  MG  --  2.6*  --   --     GFR: Estimated Creatinine Clearance: 50.7 mL/min (A) (by C-G formula based on SCr of 1.2 mg/dL (H)). Liver Function Tests: Recent Labs  Lab 06/17/22 1730 06/18/22 0424  AST 185* 145*  ALT 48* 40  ALKPHOS 190* 147*  BILITOT 12.7* 10.6*  PROT 6.0* 4.8*  ALBUMIN 1.9* 1.5*    Recent Labs  Lab 06/17/22 2056  LIPASE 55*    Recent Labs  Lab 06/17/22 1749  AMMONIA 57*    Coagulation Profile: Recent Labs  Lab 06/17/22 1750  INR 1.7*    Cardiac Enzymes: Recent Labs  Lab 06/18/22 0424  CKTOTAL 49   BNP (last 3 results) No results for input(s): "PROBNP" in the last 8760 hours. HbA1C: No results for input(s): "HGBA1C" in the last 72 hours. CBG: Recent Labs  Lab 06/17/22 1726  GLUCAP 120*    Lipid Profile: No results for input(s): "CHOL", "HDL", "LDLCALC", "TRIG", "CHOLHDL", "LDLDIRECT" in the last 72 hours. Thyroid Function Tests: No results for input(s): "TSH", "T4TOTAL", "FREET4", "T3FREE", "THYROIDAB" in the last 72 hours. Anemia Panel: No results for input(s): "VITAMINB12", "FOLATE", "FERRITIN", "TIBC", "IRON", "RETICCTPCT" in the last 72 hours. Sepsis Labs: No results for input(s): "PROCALCITON", "LATICACIDVEN" in the last 168 hours.  Recent Results (from the past 240 hour(s))  Respiratory (~20 pathogens) panel by PCR     Status: None   Collection Time: 06/18/22 11:51 AM   Specimen:  Nasopharyngeal Swab; Respiratory  Result Value Ref Range Status   Adenovirus NOT DETECTED NOT DETECTED Final   Coronavirus 229E NOT DETECTED NOT DETECTED Final    Comment: (NOTE) The Coronavirus on the Respiratory Panel, DOES NOT test for the novel  Coronavirus (2019 nCoV)    Coronavirus HKU1 NOT DETECTED NOT DETECTED Final   Coronavirus NL63 NOT DETECTED NOT DETECTED Final   Coronavirus OC43 NOT DETECTED NOT DETECTED Final   Metapneumovirus NOT DETECTED NOT DETECTED Final   Rhinovirus / Enterovirus NOT DETECTED NOT DETECTED Final   Influenza A NOT DETECTED NOT DETECTED Final   Influenza B NOT DETECTED NOT DETECTED Final   Parainfluenza Virus 1 NOT DETECTED NOT DETECTED Final   Parainfluenza Virus 2 NOT DETECTED NOT DETECTED Final   Parainfluenza Virus 3 NOT DETECTED NOT DETECTED Final   Parainfluenza Virus 4 NOT DETECTED NOT DETECTED Final   Respiratory Syncytial Virus NOT DETECTED  NOT DETECTED Final   Bordetella pertussis NOT DETECTED NOT DETECTED Final   Bordetella Parapertussis NOT DETECTED NOT DETECTED Final   Chlamydophila pneumoniae NOT DETECTED NOT DETECTED Final   Mycoplasma pneumoniae NOT DETECTED NOT DETECTED Final    Comment: Performed at [facility], [place] [address], [place], [place] [address]  Gram stain     Status: None   Collection Time: 06/18/22 12:51 PM   Specimen: Fluid  Result Value Ref Range Status   Specimen Description FLUID PERITONEAL ABDOMEN  Final   Special Requests NONE  Final   Gram Stain   Final    WBC PRESENT, PREDOMINANTLY MONONUCLEAR NO ORGANISMS SEEN CYTOSPIN SMEAR Performed at [facility], [place] [address], [place], [place] [address]    Report Status 06/18/2022 FINAL  Final         Radiology Studies: DG Chest 2 View  Result Date: 06/18/2022 CLINICAL DATA:  Leukocytosis EXAM: CHEST - 2 VIEW COMPARISON:  None Available. FINDINGS: Normal heart size, mediastinal contours, and pulmonary vascularity. Atelectasis versus infiltrate  at RIGHT base. Remaining lungs clear. No acute infiltrate, pleural effusion, or pneumothorax. Bones demineralized. IMPRESSION: Atelectasis versus infiltrate at RIGHT base. Electronically Signed   By: Lavonia Dana M.D.   On: 06/18/2022 17:10   CT Abdomen Pelvis W Contrast  Result Date: 06/17/2022 CLINICAL DATA:  Pancreatitis.  Jaundice.  Altered mental status. EXAM: CT ABDOMEN AND PELVIS WITH CONTRAST TECHNIQUE: Multidetector CT imaging of the abdomen and pelvis was performed using the standard protocol following bolus administration of intravenous contrast. RADIATION DOSE REDUCTION: This exam was performed according to the departmental dose-optimization program which includes automated exposure control, adjustment of the mA and/or kV according to patient size and/or use of iterative reconstruction technique. CONTRAST:  77m OMNIPAQUE IOHEXOL 350 MG/ML SOLN COMPARISON:  None Available. FINDINGS: Lower chest: There is a small amount of patchy atelectasis and airspace disease in the right lung base. Hepatobiliary: Gallstones are present. There is no biliary ductal dilatation. Liver is diffusely heterogeneous. Can not exclude subtle focal rounded lesion in the left lobe of the liver measuring 3.1 cm image 3/28. This may represent focal fat in the gallbladder fossa. Pancreas: Unremarkable. No pancreatic ductal dilatation or surrounding inflammatory changes. Spleen: Normal in size without focal abnormality. Adrenals/Urinary Tract: Adrenal glands are unremarkable. Kidneys are normal, without renal calculi, focal lesion, or hydronephrosis. Bladder is unremarkable. Stomach/Bowel: There is a small hiatal hernia. The stomach is otherwise within normal limits. There is no evidence for bowel obstruction, pneumatosis or free air. The appendix is not visualized. Vascular/Lymphatic: Aortic atherosclerosis. No enlarged abdominal or pelvic lymph nodes. 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 Reproductive: Uterine fibroid is present  measuring 19 mm. Ovaries are unremarkable. Other: There is moderate ascites throughout the abdomen and pelvis. There is no focal abdominal wall hernia. Musculoskeletal: No acute or significant osseous findings. IMPRESSION: 1. Moderate ascites throughout the abdomen and pelvis. 2. Heterogeneous liver with possible focal lesion in the left lobe of the liver. Correlate clinically with LFTs. Recommend further evaluation with MRI. 3. Retroperitoneal varices. 4. Cholelithiasis. 5. Uterine fibroid. Aortic Atherosclerosis (ICD10-I70.0). Electronically Signed   By: ARonney AstersM.D.   On: 06/17/2022 22:10   CT Head Wo Contrast  Result Date: 06/17/2022 CLINICAL DATA:  Altered mental status. EXAM: CT HEAD WITHOUT CONTRAST TECHNIQUE: Contiguous axial images were obtained from the base of the skull through the vertex without intravenous contrast. RADIATION DOSE REDUCTION: This exam was performed according to the departmental dose-optimization program  which includes automated exposure control, adjustment of the mA and/or kV according to patient size and/or use of iterative reconstruction technique. COMPARISON:  None Available. FINDINGS: Brain: There is mild cerebral atrophy with widening of the extra-axial spaces and ventricular dilatation. There are areas of decreased attenuation within the white matter tracts of the supratentorial brain, consistent with microvascular disease changes. Vascular: No hyperdense vessel or unexpected calcification. Skull: Normal. Negative for fracture or focal lesion. Sinuses/Orbits: No acute finding. Other: None. IMPRESSION: 1. No acute intracranial abnormality. 2. Cerebral atrophy and microvascular disease changes of the supratentorial brain. Electronically Signed   By: Virgina Norfolk M.D.   On: 06/17/2022 19:13        Scheduled Meds:  folic acid  1 mg Oral Daily   lactulose  20 g Oral BID   multivitamin with minerals  1 tablet Oral Daily   pantoprazole  40 mg Oral Daily    prednisoLONE  40 mg Oral Daily   thiamine  100 mg Oral Daily   Continuous Infusions:  0.9 % NaCl with KCl 20 mEq / L 75 mL/hr at 06/19/22 0131   azithromycin     cefTRIAXone (ROCEPHIN)  IV       LOS: 2 days    Time spent: 35 minutes    Shallon Yaklin A Shatika Grinnell, MD [facility]   If 7PM-7AM, please contact night-coverage [email or web address]  06/19/2022, 6:55 AM

## 2022-06-20 DIAGNOSIS — K7011 Alcoholic hepatitis with ascites: Secondary | ICD-10-CM | POA: Diagnosis not present

## 2022-06-20 DIAGNOSIS — K7031 Alcoholic cirrhosis of liver with ascites: Secondary | ICD-10-CM | POA: Diagnosis not present

## 2022-06-20 DIAGNOSIS — K7200 Acute and subacute hepatic failure without coma: Secondary | ICD-10-CM | POA: Diagnosis not present

## 2022-06-20 LAB — CBC
HCT: 36.5 % (ref 36.0–46.0)
Hemoglobin: 13 g/dL (ref 12.0–15.0)
MCH: 33.9 pg (ref 26.0–34.0)
MCHC: 35.6 g/dL (ref 30.0–36.0)
MCV: 95.1 fL (ref 80.0–100.0)
Platelets: 132 10*3/uL — ABNORMAL LOW (ref 150–400)
RBC: 3.84 MIL/uL — ABNORMAL LOW (ref 3.87–5.11)
RDW: 14.2 % (ref 11.5–15.5)
WBC: 17.3 10*3/uL — ABNORMAL HIGH (ref 4.0–10.5)
nRBC: 0 % (ref 0.0–0.2)

## 2022-06-20 LAB — COMPREHENSIVE METABOLIC PANEL WITH GFR
ALT: 44 U/L (ref 0–44)
AST: 125 U/L — ABNORMAL HIGH (ref 15–41)
Albumin: <1.5 g/dL — ABNORMAL LOW (ref 3.5–5.0)
Alkaline Phosphatase: 172 U/L — ABNORMAL HIGH (ref 38–126)
Anion gap: 7 (ref 5–15)
BUN: 23 mg/dL — ABNORMAL HIGH (ref 6–20)
CO2: 28 mmol/L (ref 22–32)
Calcium: 7.7 mg/dL — ABNORMAL LOW (ref 8.9–10.3)
Chloride: 98 mmol/L (ref 98–111)
Creatinine, Ser: 0.85 mg/dL (ref 0.44–1.00)
GFR, Estimated: >60 mL/min
Glucose, Bld: 121 mg/dL — ABNORMAL HIGH (ref 70–99)
Potassium: 3.5 mmol/L (ref 3.5–5.1)
Sodium: 133 mmol/L — ABNORMAL LOW (ref 135–145)
Total Bilirubin: 8.4 mg/dL — ABNORMAL HIGH (ref 0.3–1.2)
Total Protein: 4.9 g/dL — ABNORMAL LOW (ref 6.5–8.1)

## 2022-06-20 LAB — PROTIME-INR
INR: 1.7 — ABNORMAL HIGH (ref 0.8–1.2)
Prothrombin Time: 20.2 seconds — ABNORMAL HIGH (ref 11.4–15.2)

## 2022-06-20 MED ORDER — ZINC OXIDE 40 % EX OINT
1.0000 | TOPICAL_OINTMENT | Freq: Two times a day (BID) | CUTANEOUS | Status: DC | PRN
  Administered 2022-06-22 – 2022-06-24 (×2): 1 via TOPICAL
  Filled 2022-06-20: qty 57

## 2022-06-20 MED ORDER — SODIUM CHLORIDE 0.9 % IV SOLN
2.0000 g | Freq: Every day | INTRAVENOUS | Status: DC
  Administered 2022-06-20 – 2022-06-24 (×5): 2 g via INTRAVENOUS
  Filled 2022-06-20 (×5): qty 20

## 2022-06-20 MED ORDER — LACTULOSE 10 GM/15ML PO SOLN
10.0000 g | Freq: Two times a day (BID) | ORAL | Status: DC
  Administered 2022-06-20 – 2022-06-22 (×4): 10 g via ORAL
  Filled 2022-06-20 (×4): qty 15

## 2022-06-20 MED ORDER — GERHARDT'S BUTT CREAM
TOPICAL_CREAM | Freq: Two times a day (BID) | CUTANEOUS | Status: DC
  Filled 2022-06-20: qty 1

## 2022-06-20 MED ORDER — SODIUM CHLORIDE 0.9 % IV SOLN: 2.0000 g | Freq: Every day | INTRAVENOUS | Status: DC

## 2022-06-20 NOTE — Evaluation (Addendum)
Physical Therapy Evaluation Patient Details Name: Kim Holland MRN: [id number] DOB: 1960-05-28 Today's Date: 06/20/2022  History of Present Illness  62 y/o F admitted for AMS and changes in color of skin and eyes. Pt diagnosed with acute hepatic failure. PMHx: GERD, HTN, HLD, seizure disorder, anxiety, alcohol abuse but quit drinking ~3 weeks ago.  Clinical Impression  Pt presents today with impaired functional mobility, limited by strength, balance, and activity tolerance. Pt reports independence with mobility prior to admission, currently requiring min-modA for bed mobility and sit<>stand transfer, but unable to maintain standing >5 seconds today. Pt with impaired cognition from baseline, that with fatigue likely limiting pt's tolerance of today's session. Pt has full time care at home, and with anticipated progress and resolution of AMS, recommend pt return home with HHPT at discharge, will likely need DME but will continue to assess as pt progresses. Acute PT will continue to follow and progress mobility as tolerated.        Recommendations for follow up therapy are one component of a multi-disciplinary discharge planning process, led by the attending physician.  Recommendations may be updated based on patient status, additional functional criteria and insurance authorization.  Follow Up Recommendations Home health PT      Assistance Recommended at Discharge Frequent or constant Supervision/Assistance  Patient can return home with the following  A little help with walking and/or transfers;Assistance with cooking/housework;Direct supervision/assist for medications management;Direct supervision/assist for financial management;Assist for transportation;Help with stairs or ramp for entrance    Equipment Recommendations Rolling walker (2 wheels) (likely but will continue to assess)  Recommendations for Other Services       Functional Status Assessment Patient has had a recent decline in  their functional status and demonstrates the ability to make significant improvements in function in a reasonable and predictable amount of time.     Precautions / Restrictions Precautions Precautions: Fall Restrictions Weight Bearing Restrictions: No      Mobility  Bed Mobility Overal bed mobility: Needs Assistance Bed Mobility: Sit to Sidelying, Sidelying to Sit   Sidelying to sit: Min assist, HOB elevated     Sit to sidelying: Supervision General bed mobility comments: pt sidelying upon arrival of PT, requiring assist for BLE for sequencing for supine>sit along with trunk support for initial balance, pt able to return herself from sitting to sidelying with supervision and use of bedrail    Transfers Overall transfer level: Needs assistance Equipment used: 1 person hand held assist Transfers: Sit to/from Stand Sit to Stand: Mod assist           General transfer comment: mod-minA for sit<>stand at EOB. 2 trials performed however pt returning to sitting, posterior lean noted but pt unable to stand >5 seconds today due to reports of feeling weak    Ambulation/Gait                  Stairs            Wheelchair Mobility    Modified Rankin (Stroke Patients Only)       Balance Overall balance assessment: Needs assistance Sitting-balance support: Feet supported, Single extremity supported Sitting balance-Leahy Scale: Fair Sitting balance - Comments: stable static balance at EOB Postural control: Posterior lean (in standing) Standing balance support: Single extremity supported Standing balance-Leahy Scale: Zero Standing balance comment: Pt with 2 attempts at standing with min-modA however pt not tolerating, noted posterior lean, questionable effort vs cog  Pertinent Vitals/Pain Pain Assessment Pain Assessment: Faces Faces Pain Scale: Hurts a little bit Pain Location: generalized Pain Descriptors / Indicators:  Grimacing Pain Intervention(s): Monitored during session, Repositioned    Home Living Family/patient expects to be discharged to:: Private residence Living Arrangements: Spouse/significant other Available Help at Discharge: Family;Available 24 hours/day Type of Home: House Home Access: Stairs to enter Entrance Stairs-Rails: None Entrance Stairs-Number of Steps: 2   Home Layout: One level Home Equipment: None      Prior Function Prior Level of Function : Independent/Modified Independent;Working/employed;Driving             Mobility Comments: Pt reports independence with all mobility with a recent decrease over the last few days       Hand Dominance   Dominant Hand: Right    Extremity/Trunk Assessment   Upper Extremity Assessment Upper Extremity Assessment: Defer to OT evaluation    Lower Extremity Assessment Lower Extremity Assessment: Generalized weakness    Cervical / Trunk Assessment Cervical / Trunk Assessment: Normal  Communication   Communication: No difficulties  Cognition Arousal/Alertness: Awake/alert Behavior During Therapy: Flat affect Overall Cognitive Status: Impaired/Different from baseline Area of Impairment: Following commands, Safety/judgement, Problem solving                       Following Commands: Follows one step commands consistently, Follows one step commands with increased time Safety/Judgement: Decreased awareness of safety, Decreased awareness of deficits   Problem Solving: Slow processing, Decreased initiation, Requires verbal cues, Requires tactile cues General Comments: pt requiring verbal and visual cues for tasks, able to follow commands but requiring increased time, oriented to self, place, and time        General Comments General comments (skin integrity, edema, etc.): RN and MD in/out of room throughout session, pt on room air, appearing fatigued    Exercises     Assessment/Plan    PT Assessment Patient  needs continued PT services  PT Problem List Decreased strength;Decreased activity tolerance;Decreased balance;Decreased mobility;Decreased cognition;Decreased safety awareness       PT Treatment Interventions DME instruction;Gait training;Stair training;Functional mobility training;Therapeutic activities;Therapeutic exercise;Balance training;Neuromuscular re-education;Patient/family education    PT Goals (Current goals can be found in the Care Plan section)  Acute Rehab PT Goals Patient Stated Goal: get better PT Goal Formulation: With patient Time For Goal Achievement: 07/04/22 Potential to Achieve Goals: Good    Frequency Min 3X/week     Co-evaluation               AM-PAC PT "6 Clicks" Mobility  Outcome Measure Help needed turning from your back to your side while in a flat bed without using bedrails?: A Little Help needed moving from lying on your back to sitting on the side of a flat bed without using bedrails?: A Little Help needed moving to and from a bed to a chair (including a wheelchair)?: A Lot Help needed standing up from a chair using your arms (e.g., wheelchair or bedside chair)?: A Lot Help needed to walk in hospital room?: A Lot Help needed climbing 3-5 steps with a railing? : Total 6 Click Score: 13    End of Session   Activity Tolerance: Patient limited by fatigue Patient left: in bed;with call bell/phone within reach Nurse Communication: Mobility status PT Visit Diagnosis: Unsteadiness on feet (R26.81);Muscle weakness (generalized) (M62.81)    Time: 9924-2683 PT Time Calculation (min) (ACUTE ONLY): 17 min   Charges:   PT Evaluation $PT Eval Low Complexity:  [place], PT DPT Acute Rehabilitation Services Office [phone]   Luvenia Heller 06/20/2022, 2:16 PM

## 2022-06-20 NOTE — Progress Notes (Signed)
PROGRESS NOTE    Kim Holland  JSH:[id number] DOB: 1960-06-28 DOA: 06/17/2022 PCP: Thressa Sheller, MD (Inactive)   Brief Narrative: 62 year old with past medical history significant for alcohol abuse, GERD, hypertension, hyperlipidemia, seizure disorder not on medication, anxiety disorder who presents with confusion, Jaundice, acute liver failure.  She reports she stopped drinking alcohol 3 weeks prior to admission.  She has been drinking heavily for the last year.  Patient admitted with decompensated liver cirrhosis secondary to alcohol use, alcoholic hepatitis.   Assessment & Plan:   Principal Problem:   Acute hepatic failure Active Problems:   Essential hypertension   Seizure (HCC)   GERD (gastroesophageal reflux disease)   Hyperlipidemia   Hyponatremia   Hypokalemia   Leucocytosis   Alcohol abuse   AKI (acute kidney injury) ([place])   Alcoholic cirrhosis of liver with ascites (HCC)   Alcoholic hepatitis with ascites   1-Acute Liver Failure: Likely in the setting of alcohol use disorder -Patient reports stopped drinking alcohol since January 2, 3 weeks ago. -CT abdomen and pelvis: Moderate ascites throughout the abdomen and pelvis. Heterogeneous liver with possible focal lesion in the left lobe of the liver.  Recommend further evaluation with MRI. Retroperitoneal varices. Cholelithiasis. -GI has been consulted, and following, appreciate assistance.  -Maddrey's discrimination score more than 32.  Started on prednisone 1/25. -Underwent paracentesis 1/25, negative for SBP.  Continue with lactulose.  LFT trending.  MRI negative for liver mass.   2-Alcohol Abuse Disorder: Monitor on CIWA.  Thiamine, folic acid.   3-Hypokalemia:  Replaced.  Mg level 2.6 K at 3.5  4-Hyponatremia:  In setting liver failure, component hypovolemia.  On IV  NS Improving.   Heterogeneous liver with possible focal lesion in the left lobe of the liver. MRI: Focal abnormality along the  liver in segment 4 B corresponding to the finding by CT scan does not have aggressive features. No hypervascularity, washout or abnormal diffusion. This could represent an area of fatty sparing as there is some underlying diffuse fatty liver infiltration noted.   Acute Hepatic Encephalopathy;  Ammonia: 56 Continue with lactulose.   Elevation Troponin:  Denies chest pain.  Cardiology consulted. No further cardia evaluation.  Demand ischemia.   GERD:  Continue with PPI.   History of Seizure Disorder:  Monitor. Not on medications.   HTN; Hold BP medications.   AKI: Continue with IV fluids.   Leukocytosis: CAP, UTI UA with positive nitrates and leukocytes.  Chest x ray: Atelectasis versus infiltrate at right base Started on IV ceftriaxone and azithromycin day 2 WBC higher today, likely in setting of prednisone.     Estimated body mass index is 27.7 kg/m as calculated from the following:   Height as of this encounter: '5\' 5"'$  (1.651 m).   Weight as of this encounter: 75.5 kg.   DVT prophylaxis: SCD for now due to thrombocytopenia.   Code Status: Full code Family Communication: Husband updated 1/27 Disposition Plan:  Status is: Inpatient Remains inpatient appropriate because: management liver failure    Consultants:  GI Cardiology   Procedures:  Paracentesis 1/25 ECHO  Antimicrobials:    Subjective: She is alert, doesn't feel well still. Feels weak.  She has been having bowel movement.  Denies pain.   Objective: Vitals:   06/19/22 2056 06/20/22 0519 06/20/22 0926 06/20/22 1039  BP: 111/63 123/75 (!) 136/58   Pulse: 79 78 91   Resp: 17 18 (!) 22   Temp: 98.4 F (36.9 C) (!) 97.5 F (36.4  C) (!) 91.8 F (33.2 C) (!) 97.4 F (36.3 C)  TempSrc:  Oral Oral Oral  SpO2: 92% 91% 95%   Weight:      Height:        Intake/Output Summary (Last 24 hours) at 06/20/2022 1316 Last data filed at 06/20/2022 0800 Gross per 24 hour  Intake 1560.53 ml  Output 0 ml   Net 1560.53 ml    Filed Weights   06/17/22 1640 06/18/22 0538  Weight: 72.6 kg 75.5 kg    Examination:  General exam: Icteric, chronic ill appearing.  Respiratory system: CTA Cardiovascular system: S 1, S 2 RRR Gastrointestinal system: BS Present, soft, nt, nd Central nervous system: Alert, and oriented.  Extremities: No edema    Data Reviewed: I have personally reviewed following labs and imaging studies  CBC: Recent Labs  Lab 06/17/22 1730 06/18/22 0424 06/19/22 0958 06/20/22 0249  WBC 18.0* 15.4* 18.7* 17.3*  HGB 14.9 12.5 13.4 13.0  HCT 40.4 35.2* 36.6 36.5  MCV 93.3 95.9 93.8 95.1  PLT 171 131* 135* 132*    Basic Metabolic Panel: Recent Labs  Lab 06/17/22 1730 06/17/22 2056 06/18/22 0424 06/18/22 1045 06/19/22 0958 06/20/22 0249  NA 127*  --  126* 127* 129* 133*  K 2.0*  --  2.5* 2.8* 3.8 3.5  CL 76*  --  83* 81* 90* 98  CO2 34*  --  32 '31 28 28  '$ GLUCOSE 104*  --  130* 129* 142* 121*  BUN 22*  --  24* 25* 23* 23*  CREATININE 1.25*  --  1.24* 1.20* 1.12* 0.85  CALCIUM 7.8*  --  7.2* 7.7* 7.8* 7.7*  MG  --  2.6*  --   --  2.5*  --     GFR: Estimated Creatinine Clearance: 71.6 mL/min (by C-G formula based on SCr of 0.85 mg/dL). Liver Function Tests: Recent Labs  Lab 06/17/22 1730 06/18/22 0424 06/19/22 0958 06/20/22 0249  AST 185* 145* 165* 125*  ALT 48* 40 48* 44  ALKPHOS 190* 147* 173* 172*  BILITOT 12.7* 10.6* 10.5* 8.4*  PROT 6.0* 4.8* 5.3* 4.9*  ALBUMIN 1.9* 1.5* 1.7* <1.5*    Recent Labs  Lab 06/17/22 2056  LIPASE 55*    Recent Labs  Lab 06/17/22 1749  AMMONIA 57*    Coagulation Profile: Recent Labs  Lab 06/17/22 1750 06/20/22 0811  INR 1.7* 1.7*    Cardiac Enzymes: Recent Labs  Lab 06/18/22 0424  CKTOTAL 49    BNP (last 3 results) No results for input(s): "PROBNP" in the last 8760 hours. HbA1C: No results for input(s): "HGBA1C" in the last 72 hours. CBG: Recent Labs  Lab 06/17/22 1726  GLUCAP 120*     Lipid Profile: No results for input(s): "CHOL", "HDL", "LDLCALC", "TRIG", "CHOLHDL", "LDLDIRECT" in the last 72 hours. Thyroid Function Tests: No results for input(s): "TSH", "T4TOTAL", "FREET4", "T3FREE", "THYROIDAB" in the last 72 hours. Anemia Panel: No results for input(s): "VITAMINB12", "FOLATE", "FERRITIN", "TIBC", "IRON", "RETICCTPCT" in the last 72 hours. Sepsis Labs: No results for input(s): "PROCALCITON", "LATICACIDVEN" in the last 168 hours.  Recent Results (from the past 240 hour(s))  Respiratory (~20 pathogens) panel by PCR     Status: None   Collection Time: 06/18/22 11:51 AM   Specimen: Nasopharyngeal Swab; Respiratory  Result Value Ref Range Status   Adenovirus NOT DETECTED NOT DETECTED Final   Coronavirus 229E NOT DETECTED NOT DETECTED Final    Comment: (NOTE) The Coronavirus on the Respiratory Panel, DOES  NOT test for the novel  Coronavirus (2019 nCoV)    Coronavirus HKU1 NOT DETECTED NOT DETECTED Final   Coronavirus NL63 NOT DETECTED NOT DETECTED Final   Coronavirus OC43 NOT DETECTED NOT DETECTED Final   Metapneumovirus NOT DETECTED NOT DETECTED Final   Rhinovirus / Enterovirus NOT DETECTED NOT DETECTED Final   Influenza A NOT DETECTED NOT DETECTED Final   Influenza B NOT DETECTED NOT DETECTED Final   Parainfluenza Virus 1 NOT DETECTED NOT DETECTED Final   Parainfluenza Virus 2 NOT DETECTED NOT DETECTED Final   Parainfluenza Virus 3 NOT DETECTED NOT DETECTED Final   Parainfluenza Virus 4 NOT DETECTED NOT DETECTED Final   Respiratory Syncytial Virus NOT DETECTED NOT DETECTED Final   Bordetella pertussis NOT DETECTED NOT DETECTED Final   Bordetella Parapertussis NOT DETECTED NOT DETECTED Final   Chlamydophila pneumoniae NOT DETECTED NOT DETECTED Final   Mycoplasma pneumoniae NOT DETECTED NOT DETECTED Final    Comment: Performed at [facility], [place] [address] [place], [place] [address]  Culture, body fluid w Gram Stain-bottle     Status: None  (Preliminary result)   Collection Time: 06/18/22 12:51 PM   Specimen: Fluid  Result Value Ref Range Status   Specimen Description FLUID PERITONEAL ABDOMEN  Final   Special Requests BOTTLES DRAWN AEROBIC AND ANAEROBIC  Final   Culture   Final    NO GROWTH 2 DAYS Performed at [facility], [place] [address] [place], [place] [address]    Report Status PENDING  Incomplete  Gram stain     Status: None   Collection Time: 06/18/22 12:51 PM   Specimen: Fluid  Result Value Ref Range Status   Specimen Description FLUID PERITONEAL ABDOMEN  Final   Special Requests NONE  Final   Gram Stain   Final    WBC PRESENT, PREDOMINANTLY MONONUCLEAR NO ORGANISMS SEEN CYTOSPIN SMEAR Performed at [facility], [place] [address], [place], [place] [address]    Report Status 06/18/2022 FINAL  Final  Culture, blood (Routine X 2) w Reflex to ID Panel     Status: None (Preliminary result)   Collection Time: 06/18/22  3:50 PM   Specimen: BLOOD  Result Value Ref Range Status   Specimen Description BLOOD SITE NOT SPECIFIED  Final   Special Requests   Final    BOTTLES DRAWN AEROBIC AND ANAEROBIC Blood Culture results may not be optimal due to an excessive volume of blood received in culture bottles   Culture   Final    NO GROWTH 2 DAYS Performed at [facility], [place] [address] [place], [place] [address]    Report Status PENDING  Incomplete  Culture, blood (Routine X 2) w Reflex to ID Panel     Status: None (Preliminary result)   Collection Time: 06/18/22  3:50 PM   Specimen: BLOOD  Result Value Ref Range Status   Specimen Description BLOOD SITE NOT SPECIFIED  Final   Special Requests   Final    BOTTLES DRAWN AEROBIC AND ANAEROBIC Blood Culture results may not be optimal due to an inadequate volume of blood received in culture bottles   Culture   Final    NO GROWTH 2 DAYS Performed at [facility], [place] [address] [place], [place] [address]    Report Status PENDING  Incomplete          Radiology Studies: MR ABDOMEN W WO CONTRAST  Result Date: 06/19/2022 CLINICAL DATA:  Pancreatitis.  Jaundice.  New liver lesion. EXAM: MRI ABDOMEN WITHOUT AND WITH CONTRAST TECHNIQUE: Multiplanar multisequence MR imaging of the abdomen was performed both before and after the administration of intravenous contrast. CONTRAST:  7.58m GADAVIST GADOBUTROL 1 MMOL/ML IV SOLN COMPARISON:  CT 06/17/2022 FINDINGS: Lower chest: Trace pleural fluid. There is some bandlike areas of abnormal signal along the lung bases. Increased from the prior exam. Atelectasis versus infiltrate. Hepatobiliary: There is a nodular heterogeneous appearing liver consistent with chronic liver disease. Areas of signal dropout as well consistent with components of fatty liver infiltration. Liver is micronodular. There are some areas of interstitial enhancement on the most delayed consistent with components of some fibrosis. Specifically in segment 4B on the prior CT scan with some low-density which is ill-defined. This is again seen today involving segment 4B. On series 902, image 63 this measures 3.4 by 2.9 cm and cephalocaudal on series 10, image 68 3.2 cm. This abuts the gallbladder fossa. Areas somewhat low in signal on T2. On precontrast T1 slightly bright in signal compared to adjacent liver. The area is not show arterial enhancement but more slow progressive enhancement on the dynamic. This could represent an area fatty sparing rather than a true intrinsic lesion with the lack of arterial enhancement or washout. No true hypervascular lesions identified in the liver at this time No biliary ductal dilatation. The gallbladder however is dilated with stones. In addition the gallbladder wall is thickened and irregular. Patchy enhancement. Please correlate for developing acute cholecystitis. Pancreas: Pancreas again shows areas of interstitial edema. No pancreatic dilatation. Preserved pancreatic parenchymal enhancement. Spleen: Spleen  is nonenlarged. Preserved enhancement and T2 signal. Adrenals/Urinary Tract: Adrenal glands are preserved. No enhancing renal mass or collecting system dilatation. Lobular contour to the right kidney. Stomach/Bowel: Stomach is collapsed. The visualized small large bowel is nondilated. There are some subtle areas of wall thickening particularly along the right side of the colon. Nonspecific in the presence of ascites and chronic liver disease. Vascular/Lymphatic: Normal caliber aorta and IVC. There is some prominent vessels in the retroperitoneum with some varices in the left upper quadrant. The main portal vein is patent but small. Diameter at the liver hilum approaches 8 mm in diameter. Other: Mild scattered ascites. Mesenteric stranding. Anasarca. Diffuse motion artifact Musculoskeletal: Curvature of the spine with scattered degenerative changes. IMPRESSION: Dilated gallbladder with wall thickening and irregular gallbladder wall with stones. Please correlate for clinical evidence of acute cholecystitis. If needed a HIDA scan. No ductal dilatation. Changes once again of edema throughout the pancreas. Please correlate for any clinical evidence of pancreatitis. No well-defined fluid collections. Preserved pancreatic enhancement. Evidence of chronic liver disease with the micronodular liver. Scattered ascites. No splenomegaly. There are varices but the main portal vein is patent. Main portal vein is small in caliber. Focal abnormality along the liver in segment 4 B corresponding to the finding by CT scan does not have aggressive features. No hypervascularity, washout or abnormal diffusion. This could represent an area of fatty sparing as there is some underlying diffuse fatty liver infiltration noted. Liver rads category 3. Increasing lung base parenchymal changes. Atelectasis versus infiltrate. Recommend follow-up Motion Electronically Signed   By: AJill SideM.D.   On: 06/19/2022 15:36   ECHOCARDIOGRAM  COMPLETE  Result Date: 06/19/2022    ECHOCARDIOGRAM REPORT   Patient Name:   ADARNESHIA DEMARYDate of Exam: 06/19/2022 Medical Rec #:  [id number]       Height:       65.0 in Accession #:  [id number]       Weight:       166.4 lb Date of Birth:  09-02-1960         BSA:          1.830 m Patient Age:    61 years         BP:           104/79 mmHg Patient Gender: F                HR:           87 bpm. Exam Location:  Inpatient Procedure: 2D Echo, Color Doppler, Cardiac Doppler and Intracardiac            Opacification Agent Indications:    Elevated troponin  History:        Patient has no prior history of Echocardiogram examinations.                 Risk Factors:Hypertension and Dyslipidemia.  Sonographer:    Eartha Inch Referring Phys: [id number] Cheron Coryell A Esterlene Atiyeh  Sonographer Comments: Technically difficult study due to poor echo windows. Image acquisition challenging due to patient body habitus and Image acquisition challenging due to respiratory motion. IMPRESSIONS  1. Left ventricular ejection fraction, by estimation, is 60 to 65%. Left ventricular ejection fraction by 2D MOD biplane is 66.6 %. The left ventricle has normal function. The left ventricle has no regional wall motion abnormalities. Left ventricular diastolic parameters were normal.  2. Right ventricular systolic function is mildly reduced. The right ventricular size is mildly enlarged. Tricuspid regurgitation signal is inadequate for assessing PA pressure.  3. The mitral valve was not well visualized. No evidence of mitral valve regurgitation. No evidence of mitral stenosis.  4. The aortic valve is tricuspid. Aortic valve regurgitation is not visualized. No aortic stenosis is present.  5. The inferior vena cava is normal in size with greater than 50% respiratory variability, suggesting right atrial pressure of 3 mmHg. FINDINGS  Left Ventricle: Left ventricular ejection fraction, by estimation, is 60 to 65%. Left ventricular ejection fraction by 2D MOD  biplane is 66.6 %. The left ventricle has normal function. The left ventricle has no regional wall motion abnormalities. Definity contrast agent was given IV to delineate the left ventricular endocardial borders. The left ventricular internal cavity size was normal in size. There is no left ventricular hypertrophy. Left ventricular diastolic parameters were normal. Right Ventricle: The right ventricular size is mildly enlarged. No increase in right ventricular wall thickness. Right ventricular systolic function is mildly reduced. Tricuspid regurgitation signal is inadequate for assessing PA pressure. The tricuspid regurgitant velocity is 1.67 m/s, and with an assumed right atrial pressure of 3 mmHg, the estimated right ventricular systolic pressure is 35.7 mmHg. Left Atrium: Left atrial size was normal in size. Right Atrium: Right atrial size was normal in size. Pericardium: There is no evidence of pericardial effusion. Mitral Valve: The mitral valve was not well visualized. No evidence of mitral valve regurgitation. No evidence of mitral valve stenosis. MV peak gradient, 4.8 mmHg. The mean mitral valve gradient is 2.0 mmHg. Tricuspid Valve: The tricuspid valve is normal in structure. Tricuspid valve regurgitation is trivial. No evidence of tricuspid stenosis. Aortic Valve: The aortic valve is tricuspid. Aortic valve regurgitation is not visualized. No aortic stenosis is present. Aortic valve mean gradient measures 8.0 mmHg. Aortic valve peak gradient measures 12.4 mmHg. Aortic valve area, by VTI measures 2.24  cm. Pulmonic Valve: The pulmonic valve was  not well visualized. Pulmonic valve regurgitation is not visualized. No evidence of pulmonic stenosis. Aorta: The aortic root and ascending aorta are structurally normal, with no evidence of dilitation. Venous: The inferior vena cava was not well visualized. The inferior vena cava is normal in size with greater than 50% respiratory variability, suggesting right  atrial pressure of 3 mmHg. IAS/Shunts: No atrial level shunt detected by color flow Doppler.  LEFT VENTRICLE PLAX 2D                        Biplane EF (MOD) LVIDd:         4.60 cm         LV Biplane EF:   Left LVIDs:         2.90 cm                          ventricular LV PW:         0.90 cm                          ejection LV IVS:        0.90 cm                          fraction by LVOT diam:     2.00 cm                          2D MOD LV SV:         73                               biplane is LV SV Index:   40                               66.6 %. LVOT Area:     3.14 cm                                Diastology                                LV e' medial:    6.85 cm/s LV Volumes (MOD)               LV E/e' medial:  9.3 LV vol d, MOD    41.4 ml       LV e' lateral:   9.90 cm/s A2C:                           LV E/e' lateral: 6.4 LV vol d, MOD    69.6 ml A4C: LV vol s, MOD    12.2 ml A2C: LV vol s, MOD    26.5 ml A4C: LV SV MOD A2C:   29.2 ml LV SV MOD A4C:   69.6 ml LV SV MOD BP:    35.8 ml RIGHT VENTRICLE RV S prime:     15.50 cm/s TAPSE (M-mode): 2.7 cm LEFT ATRIUM             Index  RIGHT ATRIUM           Index LA diam:        3.40 cm 1.86 cm/m   RA Area:     10.70 cm LA Vol (A2C):   35.2 ml 19.24 ml/m  RA Volume:   20.90 ml  11.42 ml/m LA Vol (A4C):   33.7 ml 18.42 ml/m LA Biplane Vol: 34.9 ml 19.08 ml/m  AORTIC VALVE AV Area (Vmax):    2.41 cm AV Area (Vmean):   2.12 cm AV Area (VTI):     2.24 cm AV Vmax:           176.00 cm/s AV Vmean:          135.000 cm/s AV VTI:            0.327 m AV Peak Grad:      12.4 mmHg AV Mean Grad:      8.0 mmHg LVOT Vmax:         135.00 cm/s LVOT Vmean:        91.200 cm/s LVOT VTI:          0.233 m LVOT/AV VTI ratio: 0.71  AORTA Ao Root diam: 3.00 cm Ao Asc diam:  3.10 cm MITRAL VALVE               TRICUSPID VALVE MV Area (PHT): 2.82 cm    TR Peak grad:   11.2 mmHg MV Area VTI:   2.49 cm    TR Vmax:        167.00 cm/s MV Peak grad:  4.8 mmHg MV Mean grad:  2.0  mmHg    SHUNTS MV Vmax:       1.10 m/s    Systemic VTI:  0.23 m MV Vmean:      71.7 cm/s   Systemic Diam: 2.00 cm MV Decel Time: 269 msec MV E velocity: 63.40 cm/s MV A velocity: 91.30 cm/s MV E/A ratio:  0.69 Jenkins Rouge MD Electronically signed by Jenkins Rouge MD Signature Date/Time: 06/19/2022/9:58:47 AM    Final    DG Chest 2 View  Result Date: 06/18/2022 CLINICAL DATA:  Leukocytosis EXAM: CHEST - 2 VIEW COMPARISON:  None Available. FINDINGS: Normal heart size, mediastinal contours, and pulmonary vascularity. Atelectasis versus infiltrate at RIGHT base. Remaining lungs clear. No acute infiltrate, pleural effusion, or pneumothorax. Bones demineralized. IMPRESSION: Atelectasis versus infiltrate at RIGHT base. Electronically Signed   By: Lavonia Dana M.D.   On: 06/18/2022 17:10        Scheduled Meds:  folic acid  1 mg Oral Daily   lactulose  20 g Oral BID   multivitamin with minerals  1 tablet Oral Daily   pantoprazole  40 mg Oral Daily   prednisoLONE  40 mg Oral Daily   thiamine  100 mg Oral Daily   Continuous Infusions:  0.9 % NaCl with KCl 20 mEq / L 75 mL/hr at 06/19/22 2149   azithromycin 500 mg (06/20/22 0952)   cefTRIAXone (ROCEPHIN)  IV 2 g (06/20/22 1205)     LOS: 3 days    Time spent: 35 minutes    Nazier Neyhart A Jerelene Salaam, MD [facility]   If 7PM-7AM, please contact night-coverage [email or web address]  06/20/2022, 1:16 PM

## 2022-06-20 NOTE — Evaluation (Signed)
 Occupational Therapy Evaluation Patient Details Name: Kim Holland MRN: [id number] DOB: 07-15-1960 Today's Date: 06/20/2022   History of Present Illness Pt is a 62 y/o female admitted for acute hepatic failure and AKI. Pt recently stopped drinking 3 weeks prior to admission. JKD:TOIZ use, GERD, HTN, seizures   Clinical Impression   PTA, pt lives with spouse, typically very independent in all daily tasks and working in Scientist, research (life sciences) estate. Pt presents now with deficits in cognition, strength, standing balance and endurance. Overall, pt requires Min A for UB ADL and Max-Total A for LB ADLs including posterior hygiene today. Pt requires Mod A for bed mobility and Min A x 2 for [facility] transfers. Pt's husband present and assisting throughout, reports that he works from home and would be able to assist pt as needed. Based on family support and anticipated progress, rec HHOT follow up at [place].     Recommendations for follow up therapy are one component of a multi-disciplinary discharge planning process, led by the attending physician.  Recommendations may be updated based on patient status, additional functional criteria and insurance authorization.   Follow Up Recommendations  Home health OT     Assistance Recommended at Discharge Frequent or constant Supervision/Assistance  Patient can return home with the following A little help with walking and/or transfers;A little help with bathing/dressing/bathroom;Assistance with cooking/housework;Direct supervision/assist for medications management;Direct supervision/assist for financial management    Functional Status Assessment  Patient has had a recent decline in their functional status and demonstrates the ability to make significant improvements in function in a reasonable and predictable amount of time.  Equipment Recommendations  BSC/3in1 (TBD pending progress)    Recommendations for Other Services       Precautions / Restrictions  Precautions Precautions: Fall Restrictions Weight Bearing Restrictions: No      Mobility Bed Mobility Overal bed mobility: Needs Assistance Bed Mobility: Supine to Sit, Sit to Supine     Supine to sit: Mod assist, HOB elevated Sit to supine: Mod assist   General bed mobility comments: multiple cues needed, assist for BLE off of bed and handheld assist to lift trunk. pt quickly returned to supine on return from [facility] w/ assist for BLE up in bed    Transfers Overall transfer level: Needs assistance Equipment used: 2 person hand held assist, 1 person hand held assist Transfers: Sit to/from Stand, Bed to chair/wheelchair/BSC Sit to Stand: Min assist     Step pivot transfers: Min assist, +2 physical assistance, +2 safety/equipment     General transfer comment: Min A to stand from bedside with handheld assist. +2 for balance in stepping to/from [facility]      Balance Overall balance assessment: Needs assistance Sitting-balance support: No upper extremity supported, Feet supported Sitting balance-Leahy Scale: Fair     Standing balance support: Bilateral upper extremity supported, During functional activity, Single extremity supported Standing balance-Leahy Scale: Poor                             ADL either performed or assessed with clinical judgement   ADL Overall ADL's : Needs assistance/impaired Eating/Feeding: Set up;Sitting Eating/Feeding Details (indicate cue type and reason): assist to open containers on tray, able to self feed ice cream Grooming: Set up;Sitting   Upper Body Bathing: Minimal assistance;Sitting   Lower Body Bathing: Maximal assistance;Sit to/from stand;Sitting/lateral leans   Upper Body Dressing : Minimal assistance;Sitting   Lower Body Dressing: Maximal assistance;Sit to/from stand;Sitting/lateral leans  Toilet Transfer: Minimal assistance;+2 for physical assistance;+2 for safety/equipment;Stand-pivot;BSC/3in1 Toilet Transfer Details  (indicate cue type and reason): Min A x 2 for pivot to/from [facility], pt opting for handheld assist from OT and husband w/ unsteadiness in stepping noted Toileting- Clothing Manipulation and Hygiene: Total assistance;Sit to/from stand;Sitting/lateral lean Toileting - Clothing Manipulation Details (indicate cue type and reason): assist for posterior hygiene in standing, light assist from husband for pt balance       General ADL Comments: Limited by weakness and unsteadiness on feet     Vision Baseline Vision/History: 1 Wears glasses Ability to See in Adequate Light: 0 Adequate Patient Visual Report: No change from baseline Vision Assessment?: No apparent visual deficits     Perception     Praxis      Pertinent Vitals/Pain Pain Assessment Pain Assessment: Faces Faces Pain Scale: Hurts a little bit Pain Location: bottom Pain Descriptors / Indicators: Grimacing, Guarding, Sore Pain Intervention(s): Monitored during session, Limited activity within patient's tolerance, Other (comment) (NT applied barrier cream)     Hand Dominance Right   Extremity/Trunk Assessment Upper Extremity Assessment Upper Extremity Assessment: Generalized weakness   Lower Extremity Assessment Lower Extremity Assessment: Defer to PT evaluation   Cervical / Trunk Assessment Cervical / Trunk Assessment: Normal   Communication Communication Communication: No difficulties   Cognition Arousal/Alertness: Awake/alert Behavior During Therapy: WFL for tasks assessed/performed, Flat affect Overall Cognitive Status: Impaired/Different from baseline Area of Impairment: Attention, Following commands, Safety/judgement, Awareness, Problem solving                   Current Attention Level: Selective   Following Commands: Follows one step commands consistently, Follows one step commands with increased time Safety/Judgement: Decreased awareness of safety, Decreased awareness of deficits Awareness:  Emergent Problem Solving: Slow processing, Decreased initiation, Requires verbal cues, Requires tactile cues General Comments: able to express need to have BM, follows directions w/ increased time. able to answer all orientation questions, slower processing and flat affect with cues for problem solving and sequencing needed     General Comments  Husband present, hands on to assist    Exercises     Shoulder Instructions      Home Living Family/patient expects to be discharged to:: Private residence Living Arrangements: Spouse/significant other Available Help at Discharge: Family;Available 24 hours/day (husband works from home) Type of Home: House Home Access: Stairs to enter Technical  of Steps: 3   [place]: One level     Bathroom Shower/Tub: Teacher, early years/pre: [place]: None          Prior Functioning/Environment Prior Level of Function : Independent/Modified Independent;Working/employed;Driving                        OT Problem List: Decreased strength;Decreased activity tolerance;Impaired balance (sitting and/or standing);Decreased cognition;Decreased safety awareness;Decreased knowledge of use of DME or AE      OT Treatment/Interventions: Self-care/ADL training;Therapeutic exercise;Energy conservation;DME and/or AE instruction;Therapeutic activities;Patient/family education    OT Goals(Current goals can be found in the care plan section) Acute Rehab OT Goals Patient Stated Goal: increase strength, for loose stools to stop OT Goal Formulation: With patient/family Time For Goal Achievement: 07/03/22 Potential to Achieve Goals: Good  OT Frequency: Min 2X/week    Co-evaluation              AM-PAC OT "6 Clicks" Daily Activity     Outcome Measure Help from another  person eating meals?: A Little Help from another person taking care of personal grooming?: A Little Help from another person toileting,  which includes using toliet, bedpan, or urinal?: Total Help from another person bathing (including washing, rinsing, drying)?: A Lot Help from another person to put on and taking off regular upper body clothing?: A Little Help from another person to put on and taking off regular lower body clothing?: A Lot 6 Click Score: 14   End of Session Nurse Communication: Mobility status  Activity Tolerance: Patient tolerated treatment well Patient left: in bed;with call bell/phone within reach;with bed alarm set;with nursing/sitter in room;with family/visitor present  OT Visit Diagnosis: Unsteadiness on feet (R26.81);Other abnormalities of gait and mobility (R26.89);Muscle weakness (generalized) (M62.81)                Time: 3559-7416 OT Time Calculation (min): 21 min Charges:  OT General Charges $OT Visit: 1 Visit OT Evaluation $OT Eval Moderate Complexity: 1 Mod  Malachy Chamber, OTR/L Acute Rehab Services Office: [phone]   Layla Maw 06/20/2022, 12:31 PM

## 2022-06-21 DIAGNOSIS — K7011 Alcoholic hepatitis with ascites: Secondary | ICD-10-CM | POA: Diagnosis not present

## 2022-06-21 DIAGNOSIS — K7031 Alcoholic cirrhosis of liver with ascites: Secondary | ICD-10-CM | POA: Diagnosis not present

## 2022-06-21 DIAGNOSIS — K7200 Acute and subacute hepatic failure without coma: Secondary | ICD-10-CM | POA: Diagnosis not present

## 2022-06-21 LAB — COMPREHENSIVE METABOLIC PANEL
ALT: 57 U/L — ABNORMAL HIGH (ref 0–44)
AST: 154 U/L — ABNORMAL HIGH (ref 15–41)
Albumin: 1.6 g/dL — ABNORMAL LOW (ref 3.5–5.0)
Alkaline Phosphatase: 170 U/L — ABNORMAL HIGH (ref 38–126)
Anion gap: 9 (ref 5–15)
BUN: 19 mg/dL (ref 6–20)
CO2: 24 mmol/L (ref 22–32)
Calcium: 7.8 mg/dL — ABNORMAL LOW (ref 8.9–10.3)
Chloride: 102 mmol/L (ref 98–111)
Creatinine, Ser: 0.86 mg/dL (ref 0.44–1.00)
GFR, Estimated: >60 mL/min (ref >60)
Glucose, Bld: 79 mg/dL (ref 70–99)
Potassium: 3.8 mmol/L (ref 3.5–5.1)
Sodium: 135 mmol/L (ref 135–145)
Total Bilirubin: 7.9 mg/dL — ABNORMAL HIGH (ref 0.3–1.2)
Total Protein: 5.1 g/dL — ABNORMAL LOW (ref 6.5–8.1)

## 2022-06-21 LAB — CBC
HCT: 39.2 % (ref 36.0–46.0)
Hemoglobin: 13.9 g/dL (ref 12.0–15.0)
MCH: 34.2 pg — ABNORMAL HIGH (ref 26.0–34.0)
MCHC: 35.5 g/dL (ref 30.0–36.0)
MCV: 96.6 fL (ref 80.0–100.0)
Platelets: 152 10*3/uL (ref 150–400)
RBC: 4.06 MIL/uL (ref 3.87–5.11)
RDW: 14.5 % (ref 11.5–15.5)
WBC: 18.7 10*3/uL — ABNORMAL HIGH (ref 4.0–10.5)
nRBC: 0.1 % (ref 0.0–0.2)

## 2022-06-21 LAB — URINE CULTURE: Culture: NO GROWTH

## 2022-06-21 NOTE — Progress Notes (Signed)
[facility] ROUNDING NOTE   Subjective:  Feels hungry, she feels exhausted and tired.  Denies any pain, nausea or vomiting.    Objective: Vital signs in last 24 hours: Temp:  [97.5 F (36.4 C)-98.5 F (36.9 C)] 98.5 F (36.9 C) (01/28 1009) Pulse Rate:  [88-98] 90 (01/28 1009) Resp:  [16-19] 16 (01/28 1009) BP: (121-147)/(64-113) 132/64 (01/28 1009) SpO2:  [90 %-95 %] 95 % (01/28 1009) Last BM Date : 06/20/22 General: NAD, alert and oriented, no asterixis Abdomen: Soft, mild distention, nontender Ext: No edema    Intake/Output from previous day: 01/27 0701 - 01/28 0700 In: 1816.7 [P.O.:440; I.V.:1026.7; IV Piggyback:350] Out: 50 [Urine:50] Intake/Output this shift: No intake/output data recorded.   Lab Results: Recent Labs    06/19/22 0958 06/20/22 0249 06/21/22 0811  WBC 18.7* 17.3* 18.7*  HGB 13.4 13.0 13.9  PLT 135* 132* 152  MCV 93.8 95.1 96.6   BMET Recent Labs    06/19/22 0958 06/20/22 0249 06/21/22 0811  NA 129* 133* 135  K 3.8 3.5 3.8  CL 90* 98 102  CO2 '28 28 24  '$ GLUCOSE 142* 121* 79  BUN 23* 23* 19  CREATININE 1.12* 0.85 0.86  CALCIUM 7.8* 7.7* 7.8*   LFT Recent Labs    06/19/22 0958 06/20/22 0249 06/21/22 0811  PROT 5.3* 4.9* 5.1*  ALBUMIN 1.7* <1.5* 1.6*  AST 165* 125* 154*  ALT 48* 44 57*  ALKPHOS 173* 172* 170*  BILITOT 10.5* 8.4* 7.9*  BILIDIR 5.7*  --   --   IBILI 4.8*  --   --    PT/INR Recent Labs    06/20/22 0811  INR 1.7*      Imaging/Other results: MR ABDOMEN W WO CONTRAST  Result Date: 06/19/2022 CLINICAL DATA:  Pancreatitis.  Jaundice.  New liver lesion. EXAM: MRI ABDOMEN WITHOUT AND WITH CONTRAST TECHNIQUE: Multiplanar multisequence MR imaging of the abdomen was performed both before and after the administration of intravenous contrast. CONTRAST:  7.42m GADAVIST GADOBUTROL 1 MMOL/ML IV SOLN COMPARISON:  CT 06/17/2022 FINDINGS: Lower chest: Trace pleural fluid. There is some bandlike areas of  abnormal signal along the lung bases. Increased from the prior exam. Atelectasis versus infiltrate. Hepatobiliary: There is a nodular heterogeneous appearing liver consistent with chronic liver disease. Areas of signal dropout as well consistent with components of fatty liver infiltration. Liver is micronodular. There are some areas of interstitial enhancement on the most delayed consistent with components of some fibrosis. Specifically in segment 4B on the prior CT scan with some low-density which is ill-defined. This is again seen today involving segment 4B. On series 902, image 63 this measures 3.4 by 2.9 cm and cephalocaudal on series 10, image 68 3.2 cm. This abuts the gallbladder fossa. Areas somewhat low in signal on T2. On precontrast T1 slightly bright in signal compared to adjacent liver. The area is not show arterial enhancement but more slow progressive enhancement on the dynamic. This could represent an area fatty sparing rather than a true intrinsic lesion with the lack of arterial enhancement or washout. No true hypervascular lesions identified in the liver at this time No biliary ductal dilatation. The gallbladder however is dilated with stones. In addition the gallbladder wall is thickened and irregular. Patchy enhancement. Please correlate for developing acute cholecystitis. Pancreas: Pancreas again shows areas of interstitial edema. No pancreatic dilatation. Preserved pancreatic parenchymal enhancement. Spleen: Spleen is nonenlarged. Preserved enhancement and T2 signal. Adrenals/Urinary Tract: Adrenal glands are preserved. No enhancing renal mass or  collecting system dilatation. Lobular contour to the right kidney. Stomach/Bowel: Stomach is collapsed. The visualized small large bowel is nondilated. There are some subtle areas of wall thickening particularly along the right side of the colon. Nonspecific in the presence of ascites and chronic liver disease. Vascular/Lymphatic: Normal caliber aorta  and IVC. There is some prominent vessels in the retroperitoneum with some varices in the left upper quadrant. The main portal vein is patent but small. Diameter at the liver hilum approaches 8 mm in diameter. Other: Mild scattered ascites. Mesenteric stranding. Anasarca. Diffuse motion artifact Musculoskeletal: Curvature of the spine with scattered degenerative changes. IMPRESSION: Dilated gallbladder with wall thickening and irregular gallbladder wall with stones. Please correlate for clinical evidence of acute cholecystitis. If needed a HIDA scan. No ductal dilatation. Changes once again of edema throughout the pancreas. Please correlate for any clinical evidence of pancreatitis. No well-defined fluid collections. Preserved pancreatic enhancement. Evidence of chronic liver disease with the micronodular liver. Scattered ascites. No splenomegaly. There are varices but the main portal vein is patent. Main portal vein is small in caliber. Focal abnormality along the liver in segment 4 B corresponding to the finding by CT scan does not have aggressive features. No hypervascularity, washout or abnormal diffusion. This could represent an area of fatty sparing as there is some underlying diffuse fatty liver infiltration noted. Liver rads category 3. Increasing lung base parenchymal changes. Atelectasis versus infiltrate. Recommend follow-up Motion Electronically Signed   By: Jill Side M.D.   On: 06/19/2022 15:36      Assessment &Plan  62 year old female with alcohol abuse, decompensated EtOH cirrhosis with ascites and acute alcoholic hepatitis MELD 3.0: 26 at 06/21/2022  8:11 AM MELD-Na: 21 at 06/21/2022  8:11 AM  Discriminant function score >32 Ascites fluid negative for SBP, urine and blood culture pending.  UA negative.  No sign of active infection Started on oral prednisolone 40 mg daily yesterday,  continue for 4 weeks course   LFT are trending down, continue to monitor daily LFT, PT and INR    Discussed alcohol cessation Monitor for alcohol withdrawal Low-sodium diet  Follow-up with [place] [facility] on discharge in 2 to 3 weeks.  GI will sign off, available if have any questions   K. Denzil Magnuson , MD [phone]  [facility]

## 2022-06-21 NOTE — Progress Notes (Signed)
PROGRESS NOTE    Kim Holland  PIR:[id number] DOB: December 02, 1960 DOA: 06/17/2022 PCP: Thressa Sheller, MD (Inactive)   Brief Narrative: 62 year old with past medical history significant for alcohol abuse, GERD, hypertension, hyperlipidemia, seizure disorder not on medication, anxiety disorder who presents with confusion, Jaundice, acute liver failure.  She reports she stopped drinking alcohol 3 weeks prior to admission.  She has been drinking heavily for the last year.  Patient admitted with decompensated liver cirrhosis secondary to alcohol use, alcoholic hepatitis.   Assessment & Plan:   Principal Problem:   Acute hepatic failure Active Problems:   Essential hypertension   Seizure (HCC)   GERD (gastroesophageal reflux disease)   Hyperlipidemia   Hyponatremia   Hypokalemia   Leucocytosis   Alcohol abuse   AKI (acute kidney injury) ([place])   Alcoholic cirrhosis of liver with ascites (HCC)   Alcoholic hepatitis with ascites   1-Acute Liver Failure: Likely in the setting of alcohol use disorder -Patient reports stopped drinking alcohol since January 2, 3 weeks ago. -CT abdomen and pelvis: Moderate ascites throughout the abdomen and pelvis. Heterogeneous liver with possible focal lesion in the left lobe of the liver.  Recommend further evaluation with MRI. Retroperitoneal varices. Cholelithiasis. -GI has been consulted, and following, appreciate assistance.  -Maddrey's discrimination score more than 32.  Started on prednisone 1/25. -Underwent paracentesis 1/25, negative for SBP.  Continue with lactulose.  LFT trending.  MRI negative for liver mass.   2-Alcohol Abuse Disorder: Monitor on CIWA.  Thiamine, folic acid.   3-Hypokalemia:  Replaced.  Mg level 2.6 K at 3.5  4-Hyponatremia:  In setting liver failure, component hypovolemia.  On IV  NS Improving.   Heterogeneous liver with possible focal lesion in the left lobe of the liver. MRI: Focal abnormality along the  liver in segment 4 B corresponding to the finding by CT scan does not have aggressive features. No hypervascularity, washout or abnormal diffusion. This could represent an area of fatty sparing as there is some underlying diffuse fatty liver infiltration noted.   Acute Hepatic Encephalopathy;  Ammonia: 56 Continue with lactulose.   Elevation Troponin:  Denies chest pain.  Cardiology consulted. No further cardia evaluation.  Demand ischemia.   GERD:  Continue with PPI.   History of Seizure Disorder:  Monitor. Not on medications.   HTN; Hold BP medications.   AKI: Continue with IV fluids.   Leukocytosis: CAP, UTI UA with positive nitrates and leukocytes.  Chest x ray: Atelectasis versus infiltrate at right base Started on IV ceftriaxone and azithromycin day 2 WBC higher , likely in setting of prednisone.  Afebrile.    Estimated body mass index is 27.7 kg/m as calculated from the following:   Height as of this encounter: '5\' 5"'$  (1.651 m).   Weight as of this encounter: 75.5 kg.   DVT prophylaxis: SCD for now due to thrombocytopenia.   Code Status: Full code Family Communication: Husband updated 1/27 Disposition Plan:  Status is: Inpatient Remains inpatient appropriate because: management liver failure    Consultants:  GI Cardiology   Procedures:  Paracentesis 1/25 ECHO  Antimicrobials:    Subjective: Appears weak. She is tired. Low energy   Objective: Vitals:   06/20/22 1732 06/20/22 2106 06/21/22 0552 06/21/22 1009  BP: (!) 147/113 124/75 121/70 132/64  Pulse: 98 89 88 90  Resp: '19 18 18 16  '$ Temp: 98.5 F (36.9 C) (!) 97.5 F (36.4 C) 97.8 F (36.6 C) 98.5 F (36.9 C)  TempSrc:  Oral Oral   SpO2: 93% 95% 90% 95%  Weight:      Height:        Intake/Output Summary (Last 24 hours) at 06/21/2022 1239 Last data filed at 06/21/2022 1740 Gross per 24 hour  Intake 1696.69 ml  Output 50 ml  Net 1646.69 ml    Filed Weights   06/17/22 1640 06/18/22  0538  Weight: 72.6 kg 75.5 kg    Examination:  General exam: Icteric , chronic ill appearing.  Respiratory system: CTA Cardiovascular system: S 1, S 2 RRR Gastrointestinal system: BS present, soft, nt, distended Central nervous system: alert Extremities: No edema    Data Reviewed: I have personally reviewed following labs and imaging studies  CBC: Recent Labs  Lab 06/17/22 1730 06/18/22 0424 06/19/22 0958 06/20/22 0249 06/21/22 0811  WBC 18.0* 15.4* 18.7* 17.3* 18.7*  HGB 14.9 12.5 13.4 13.0 13.9  HCT 40.4 35.2* 36.6 36.5 39.2  MCV 93.3 95.9 93.8 95.1 96.6  PLT 171 131* 135* 132* 814    Basic Metabolic Panel: Recent Labs  Lab 06/17/22 2056 06/18/22 0424 06/18/22 1045 06/19/22 0958 06/20/22 0249 06/21/22 0811  NA  --  126* 127* 129* 133* 135  K  --  2.5* 2.8* 3.8 3.5 3.8  CL  --  83* 81* 90* 98 102  CO2  --  32 '31 28 28 24  '$ GLUCOSE  --  130* 129* 142* 121* 79  BUN  --  24* 25* 23* 23* 19  CREATININE  --  1.24* 1.20* 1.12* 0.85 0.86  CALCIUM  --  7.2* 7.7* 7.8* 7.7* 7.8*  MG 2.6*  --   --  2.5*  --   --     GFR: Estimated Creatinine Clearance: 70.7 mL/min (by C-G formula based on SCr of 0.86 mg/dL). Liver Function Tests: Recent Labs  Lab 06/17/22 1730 06/18/22 0424 06/19/22 0958 06/20/22 0249 06/21/22 0811  AST 185* 145* 165* 125* 154*  ALT 48* 40 48* 44 57*  ALKPHOS 190* 147* 173* 172* 170*  BILITOT 12.7* 10.6* 10.5* 8.4* 7.9*  PROT 6.0* 4.8* 5.3* 4.9* 5.1*  ALBUMIN 1.9* 1.5* 1.7* <1.5* 1.6*    Recent Labs  Lab 06/17/22 2056  LIPASE 55*    Recent Labs  Lab 06/17/22 1749  AMMONIA 57*    Coagulation Profile: Recent Labs  Lab 06/17/22 1750 06/20/22 0811  INR 1.7* 1.7*    Cardiac Enzymes: Recent Labs  Lab 06/18/22 0424  CKTOTAL 49    BNP (last 3 results) No results for input(s): "PROBNP" in the last 8760 hours. HbA1C: No results for input(s): "HGBA1C" in the last 72 hours. CBG: Recent Labs  Lab 06/17/22 1726  GLUCAP  120*    Lipid Profile: No results for input(s): "CHOL", "HDL", "LDLCALC", "TRIG", "CHOLHDL", "LDLDIRECT" in the last 72 hours. Thyroid Function Tests: No results for input(s): "TSH", "T4TOTAL", "FREET4", "T3FREE", "THYROIDAB" in the last 72 hours. Anemia Panel: No results for input(s): "VITAMINB12", "FOLATE", "FERRITIN", "TIBC", "IRON", "RETICCTPCT" in the last 72 hours. Sepsis Labs: No results for input(s): "PROCALCITON", "LATICACIDVEN" in the last 168 hours.  Recent Results (from the past 240 hour(s))  Respiratory (~20 pathogens) panel by PCR     Status: None   Collection Time: 06/18/22 11:51 AM   Specimen: Nasopharyngeal Swab; Respiratory  Result Value Ref Range Status   Adenovirus NOT DETECTED NOT DETECTED Final   Coronavirus 229E NOT DETECTED NOT DETECTED Final    Comment: (NOTE) The Coronavirus on the Respiratory Panel, DOES NOT  test for the novel  Coronavirus (2019 nCoV)    Coronavirus HKU1 NOT DETECTED NOT DETECTED Final   Coronavirus NL63 NOT DETECTED NOT DETECTED Final   Coronavirus OC43 NOT DETECTED NOT DETECTED Final   Metapneumovirus NOT DETECTED NOT DETECTED Final   Rhinovirus / Enterovirus NOT DETECTED NOT DETECTED Final   Influenza A NOT DETECTED NOT DETECTED Final   Influenza B NOT DETECTED NOT DETECTED Final   Parainfluenza Virus 1 NOT DETECTED NOT DETECTED Final   Parainfluenza Virus 2 NOT DETECTED NOT DETECTED Final   Parainfluenza Virus 3 NOT DETECTED NOT DETECTED Final   Parainfluenza Virus 4 NOT DETECTED NOT DETECTED Final   Respiratory Syncytial Virus NOT DETECTED NOT DETECTED Final   Bordetella pertussis NOT DETECTED NOT DETECTED Final   Bordetella Parapertussis NOT DETECTED NOT DETECTED Final   Chlamydophila pneumoniae NOT DETECTED NOT DETECTED Final   Mycoplasma pneumoniae NOT DETECTED NOT DETECTED Final    Comment: Performed at [facility], [place] [address] [place], [place] [address]  Culture, body fluid w Gram Stain-bottle     Status: None  (Preliminary result)   Collection Time: 06/18/22 12:51 PM   Specimen: Fluid  Result Value Ref Range Status   Specimen Description FLUID PERITONEAL ABDOMEN  Final   Special Requests BOTTLES DRAWN AEROBIC AND ANAEROBIC  Final   Culture   Final    NO GROWTH 3 DAYS Performed at [facility], [place] [address], [place], [place] [address]    Report Status PENDING  Incomplete  Gram stain     Status: None   Collection Time: 06/18/22 12:51 PM   Specimen: Fluid  Result Value Ref Range Status   Specimen Description FLUID PERITONEAL ABDOMEN  Final   Special Requests NONE  Final   Gram Stain   Final    WBC PRESENT, PREDOMINANTLY MONONUCLEAR NO ORGANISMS SEEN CYTOSPIN SMEAR Performed at [facility], [place] [address] [place], [place] [address]    Report Status 06/18/2022 FINAL  Final  Culture, blood (Routine X 2) w Reflex to ID Panel     Status: None (Preliminary result)   Collection Time: 06/18/22  3:50 PM   Specimen: BLOOD  Result Value Ref Range Status   Specimen Description BLOOD SITE NOT SPECIFIED  Final   Special Requests   Final    BOTTLES DRAWN AEROBIC AND ANAEROBIC Blood Culture results may not be optimal due to an excessive volume of blood received in culture bottles   Culture   Final    NO GROWTH 3 DAYS Performed at [facility], [place] [address] [place], [place] [address]    Report Status PENDING  Incomplete  Culture, blood (Routine X 2) w Reflex to ID Panel     Status: None (Preliminary result)   Collection Time: 06/18/22  3:50 PM   Specimen: BLOOD  Result Value Ref Range Status   Specimen Description BLOOD SITE NOT SPECIFIED  Final   Special Requests   Final    BOTTLES DRAWN AEROBIC AND ANAEROBIC Blood Culture results may not be optimal due to an inadequate volume of blood received in culture bottles   Culture   Final    NO GROWTH 3 DAYS Performed at [facility], [place] [address], [place],  [address]    Report Status PENDING  Incomplete  Urine  Culture (for pregnant, neutropenic or urologic patients or patients with an indwelling urinary catheter)     Status: None   Collection Time:  06/20/22  4:18 PM   Specimen: Urine, Clean Catch  Result Value Ref Range Status   Specimen Description URINE, CLEAN CATCH  Final   Special Requests NONE  Final   Culture   Final    NO GROWTH Performed at [facility], [address] [place], [place] [address]    Report Status 06/21/2022 FINAL  Final         Radiology Studies: MR ABDOMEN W WO CONTRAST  Result Date: 06/19/2022 CLINICAL DATA:  Pancreatitis.  Jaundice.  New liver lesion. EXAM: MRI ABDOMEN WITHOUT AND WITH CONTRAST TECHNIQUE: Multiplanar multisequence MR imaging of the abdomen was performed both before and after the administration of intravenous contrast. CONTRAST:  7.58m GADAVIST GADOBUTROL 1 MMOL/ML IV SOLN COMPARISON:  CT 06/17/2022 FINDINGS: Lower chest: Trace pleural fluid. There is some bandlike areas of abnormal signal along the lung bases. Increased from the prior exam. Atelectasis versus infiltrate. Hepatobiliary: There is a nodular heterogeneous appearing liver consistent with chronic liver disease. Areas of signal dropout as well consistent with components of fatty liver infiltration. Liver is micronodular. There are some areas of interstitial enhancement on the most delayed consistent with components of some fibrosis. Specifically in segment 4B on the prior CT scan with some low-density which is ill-defined. This is again seen today involving segment 4B. On series 902, image 63 this measures 3.4 by 2.9 cm and cephalocaudal on series 10, image 68 3.2 cm. This abuts the gallbladder fossa. Areas somewhat low in signal on T2. On precontrast T1 slightly bright in signal compared to adjacent liver. The area is not show arterial enhancement but more slow progressive enhancement on the dynamic. This could represent an area fatty sparing rather than a true intrinsic lesion with the lack  of arterial enhancement or washout. No true hypervascular lesions identified in the liver at this time No biliary ductal dilatation. The gallbladder however is dilated with stones. In addition the gallbladder wall is thickened and irregular. Patchy enhancement. Please correlate for developing acute cholecystitis. Pancreas: Pancreas again shows areas of interstitial edema. No pancreatic dilatation. Preserved pancreatic parenchymal enhancement. Spleen: Spleen is nonenlarged. Preserved enhancement and T2 signal. Adrenals/Urinary Tract: Adrenal glands are preserved. No enhancing renal mass or collecting system dilatation. Lobular contour to the right kidney. Stomach/Bowel: Stomach is collapsed. The visualized small large bowel is nondilated. There are some subtle areas of wall thickening particularly along the right side of the colon. Nonspecific in the presence of ascites and chronic liver disease. Vascular/Lymphatic: Normal caliber aorta and IVC. There is some prominent vessels in the retroperitoneum with some varices in the left upper quadrant. The main portal vein is patent but small. Diameter at the liver hilum approaches 8 mm in diameter. Other: Mild scattered ascites. Mesenteric stranding. Anasarca. Diffuse motion artifact Musculoskeletal: Curvature of the spine with scattered degenerative changes. IMPRESSION: Dilated gallbladder with wall thickening and irregular gallbladder wall with stones. Please correlate for clinical evidence of acute cholecystitis. If needed a HIDA scan. No ductal dilatation. Changes once again of edema throughout the pancreas. Please correlate for any clinical evidence of pancreatitis. No well-defined fluid collections. Preserved pancreatic enhancement. Evidence of chronic liver disease with the micronodular liver. Scattered ascites. No splenomegaly. There are varices but the main portal vein is patent. Main portal vein is small in caliber. Focal abnormality along the liver in segment 4 B  corresponding to the finding by CT scan does not have aggressive features. No hypervascularity, washout or abnormal diffusion. This could represent an  area of fatty sparing as there is some underlying diffuse fatty liver infiltration noted. Liver rads category 3. Increasing lung base parenchymal changes. Atelectasis versus infiltrate. Recommend follow-up [place] Electronically Signed   By: Jill Side M.D.   On: 06/19/2022 15:36        Scheduled Meds:  folic acid  1 mg Oral Daily   Gerhardt's butt cream   Topical BID   lactulose  10 g Oral BID   multivitamin with minerals  1 tablet Oral Daily   pantoprazole  40 mg Oral Daily   prednisoLONE  40 mg Oral Daily   thiamine  100 mg Oral Daily   Continuous Infusions:  0.9 % NaCl with KCl 20 mEq / L 75 mL/hr at 06/21/22 0603   azithromycin 500 mg (06/21/22 1011)   cefTRIAXone (ROCEPHIN)  IV 2 g (06/21/22 1008)     LOS: 4 days    Time spent: 35 minutes    Tiannah Greenly A Manvi Guilliams, MD [facility]   If 7PM-7AM, please contact night-coverage [email or web address]  06/21/2022, 12:39 PM

## 2022-06-21 NOTE — Plan of Care (Signed)
  Problem: Education: Goal: Knowledge of General Education information will improve Description Including pain rating scale, medication(s)/side effects and non-pharmacologic comfort measures Outcome: Progressing   

## 2022-06-22 DIAGNOSIS — K7011 Alcoholic hepatitis with ascites: Secondary | ICD-10-CM | POA: Diagnosis not present

## 2022-06-22 DIAGNOSIS — K7031 Alcoholic cirrhosis of liver with ascites: Secondary | ICD-10-CM | POA: Diagnosis not present

## 2022-06-22 DIAGNOSIS — K7200 Acute and subacute hepatic failure without coma: Secondary | ICD-10-CM | POA: Diagnosis not present

## 2022-06-22 LAB — AMMONIA: Ammonia: 52 umol/L — ABNORMAL HIGH (ref 9–35)

## 2022-06-22 LAB — COMPREHENSIVE METABOLIC PANEL
ALT: 57 U/L — ABNORMAL HIGH (ref 0–44)
AST: 162 U/L — ABNORMAL HIGH (ref 15–41)
Albumin: 1.5 g/dL — ABNORMAL LOW (ref 3.5–5.0)
Alkaline Phosphatase: 177 U/L — ABNORMAL HIGH (ref 38–126)
Anion gap: 8 (ref 5–15)
BUN: 17 mg/dL (ref 6–20)
CO2: 26 mmol/L (ref 22–32)
Calcium: 7.8 mg/dL — ABNORMAL LOW (ref 8.9–10.3)
Chloride: 100 mmol/L (ref 98–111)
Creatinine, Ser: 0.9 mg/dL (ref 0.44–1.00)
GFR, Estimated: >60 mL/min (ref >60)
Glucose, Bld: 98 mg/dL (ref 70–99)
Potassium: 3.7 mmol/L (ref 3.5–5.1)
Sodium: 134 mmol/L — ABNORMAL LOW (ref 135–145)
Total Bilirubin: 6.9 mg/dL — ABNORMAL HIGH (ref 0.3–1.2)
Total Protein: 4.9 g/dL — ABNORMAL LOW (ref 6.5–8.1)

## 2022-06-22 LAB — CBC
HCT: 40.1 % (ref 36.0–46.0)
Hemoglobin: 13.6 g/dL (ref 12.0–15.0)
MCH: 33.4 pg (ref 26.0–34.0)
MCHC: 33.9 g/dL (ref 30.0–36.0)
MCV: 98.5 fL (ref 80.0–100.0)
Platelets: 166 10*3/uL (ref 150–400)
RBC: 4.07 MIL/uL (ref 3.87–5.11)
RDW: 14.3 % (ref 11.5–15.5)
WBC: 18 10*3/uL — ABNORMAL HIGH (ref 4.0–10.5)
nRBC: 0 % (ref 0.0–0.2)

## 2022-06-22 MED ORDER — LACTULOSE 10 GM/15ML PO SOLN
10.0000 g | Freq: Every day | ORAL | Status: DC
  Administered 2022-06-24: 10 g via ORAL
  Filled 2022-06-22: qty 15

## 2022-06-22 MED ORDER — RIFAXIMIN 550 MG PO TABS
550.0000 mg | ORAL_TABLET | Freq: Two times a day (BID) | ORAL | Status: DC
  Administered 2022-06-22 – 2022-06-24 (×5): 550 mg via ORAL
  Filled 2022-06-22 (×6): qty 1

## 2022-06-22 NOTE — Progress Notes (Signed)
Occupational Therapy Treatment Patient Details Name: Kim Holland MRN: [id number] DOB: 28-Nov-1960 Today's Date: 06/22/2022   History of present illness 62 y/o F admitted for AMS and changes in color of skin and eyes. Pt diagnosed with acute hepatic failure. PMHx: GERD, HTN, HLD, seizure disorder, anxiety, alcohol abuse but quit drinking ~3 weeks ago.   OT comments  Pt making gradual progress towards OT goals and benefits from encouragement to participate. With trial of RW and noted improved steadiness using DME, pt able to progress BSC and recliner transfers to min guard-Min A. Pt continues to require assist for LB ADLs d/t weakness and discomfort. Husband present and hands on to assist, plans to provide physical assist for pt at home as well. Provided UE HEP and theraband for pt to trial w/ spouse later today. Continue to rec HHOT, as well as BSC and RW for home use.    Recommendations for follow up therapy are one component of a multi-disciplinary discharge planning process, led by the attending physician.  Recommendations may be updated based on patient status, additional functional criteria and insurance authorization.    Follow Up Recommendations  Home health OT     Assistance Recommended at Discharge Frequent or constant Supervision/Assistance  Patient can return home with the following  A little help with walking and/or transfers;A little help with bathing/dressing/bathroom;Assistance with cooking/housework;Direct supervision/assist for medications management;Direct supervision/assist for financial management   Equipment Recommendations  BSC/3in1;Other (comment) (RW)    Recommendations for Other Services      Precautions / Restrictions Precautions Precautions: Fall Restrictions Weight Bearing Restrictions: No       Mobility Bed Mobility Overal bed mobility: Needs Assistance Bed Mobility: Supine to Sit     Supine to sit: Min assist, HOB elevated     General bed  mobility comments: use of bedrails, light Min A to light trunk    Transfers Overall transfer level: Needs assistance Equipment used: Rolling walker (2 wheels) Transfers: Bed to chair/wheelchair/BSC, Sit to/from Stand Sit to Stand: Min assist, Min guard     Step pivot transfers: Min assist     General transfer comment: initially Min A to stand from bedside progressing to min guard from [facility]. did not attend to cues for hand placement to stand. Min A to navigate RW to [facility] and to recliner     Balance Overall balance assessment: Needs assistance Sitting-balance support: Feet supported, Single extremity supported Sitting balance-Leahy Scale: Fair     Standing balance support: Bilateral upper extremity supported, During functional activity Standing balance-Leahy Scale: Poor                             ADL either performed or assessed with clinical judgement   ADL Overall ADL's : Needs assistance/impaired Eating/Feeding: Set up;Sitting                   Lower Body Dressing: Maximal assistance;Sit to/from stand;Sitting/lateral leans Lower Body Dressing Details (indicate cue type and reason): cued to try donning socks, initially reported she could not but with encouragement, did attempt and reported too much discomfort Toilet Transfer: Minimal assistance;Stand-pivot;BSC/3in1;Rolling walker (2 wheels) Toilet Transfer Details (indicate cue type and reason): cues for hand placement w/ poor carryover, improved steadiness with trial of RW Toileting- Clothing Manipulation and Hygiene: Total assistance;Sit to/from stand;Sitting/lateral lean Toileting - Clothing Manipulation Details (indicate cue type and reason): assist for posterior hygiene in standing,     Functional mobility  during ADLs: Minimal assistance;Rolling walker (2 wheels);Cueing for sequencing;Cueing for safety General ADL Comments: Improving steadiness with use of RW, progression to OOB to recliner. Provided UE  HEP w/ theraband and plans to assess in next session (provided 2 theraband so husband can do exercises w/ pt to improve participation).    Extremity/Trunk Assessment Upper Extremity Assessment Upper Extremity Assessment: Generalized weakness   Lower Extremity Assessment Lower Extremity Assessment: Defer to PT evaluation        Vision   Vision Assessment?: No apparent visual deficits   Perception     Praxis      Cognition Arousal/Alertness: Awake/alert Behavior During Therapy: WFL for tasks assessed/performed, Flat affect Overall Cognitive Status: Impaired/Different from baseline Area of Impairment: Safety/judgement, Problem solving                         Safety/Judgement: Decreased awareness of safety, Decreased awareness of deficits Awareness: Emergent   General Comments: can do more than pt believes she can; limited by anxiety at times but responds well to encouragement        Exercises      Shoulder Instructions       General Comments Husband present, hands on to assist. encouraged husband assist w/ pt to [facility] (still encouraged using call bell but if urgent, feel husband can safely and appropriately assist pt to [facility]; would benefit from hands on assist as he will be assisting pt at home)    Pertinent Vitals/ Pain       Pain Assessment Pain Assessment: Faces Faces Pain Scale: Hurts little more Pain Location: bottom, stomach Pain Descriptors / Indicators: Grimacing, Guarding Pain Intervention(s): Monitored during session, Limited activity within patient's tolerance, Repositioned  Home Living                                          Prior Functioning/Environment              Frequency  Min 2X/week        Progress Toward Goals  OT Goals(current goals can now be found in the care plan section)  Progress towards OT goals: Progressing toward goals  Acute Rehab OT Goals Patient Stated Goal: home soon OT Goal Formulation:  With patient/family Time For Goal Achievement: 07/03/22 Potential to Achieve Goals: Good ADL Goals Pt Will Perform Lower Body Dressing: with supervision;sit to/from stand;sitting/lateral leans Pt Will Transfer to Toilet: with supervision;ambulating Pt Will Perform Toileting - Clothing Manipulation and hygiene: with supervision;sit to/from stand;sitting/lateral leans Additional ADL Goal #1: Pt to increase standing activity tolerance > 7 min during ADLs/mobility without rest break  Plan Discharge plan remains appropriate    Co-evaluation                 AM-PAC OT "6 Clicks" Daily Activity     Outcome Measure   Help from another person eating meals?: A Little Help from another person taking care of personal grooming?: A Little Help from another person toileting, which includes using toliet, bedpan, or urinal?: Total Help from another person bathing (including washing, rinsing, drying)?: A Lot Help from another person to put on and taking off regular upper body clothing?: A Little Help from another person to put on and taking off regular lower body clothing?: A Lot 6 Click Score: 14    End of Session Equipment Utilized During Treatment: Gait  belt;Rolling walker (2 wheels)  OT Visit Diagnosis: Unsteadiness on feet (R26.81);Other abnormalities of gait and mobility (R26.89);Muscle weakness (generalized) (M62.81)   Activity Tolerance Patient tolerated treatment well   Patient Left in chair;with call bell/phone within reach;with family/visitor present   Nurse Communication Mobility status        Time: 3225-6720 OT Time Calculation (min): 32 min  Charges: OT General Charges $OT Visit: 1 Visit OT Treatments $Self Care/Home Management : 23-37 mins  Kim Holland, OTR/L Acute Rehab Services Office: [phone]   Kim Holland 06/22/2022, 2:24 PM

## 2022-06-22 NOTE — TOC Progression Note (Signed)
Transition of Care [facility]) - Progression Note    Patient Details  Name: Kim Holland MRN: [id number] Date of Birth: [birth year]  [facility] CM/SW Contact  Tom-Johnson, Renea Ee, RN Phone Number: 06/22/2022, 4:28 PM  Clinical Narrative:     CM spoke with patient and spouse at bedside about Home health recommendations. Patient states she has no preference. CM called in referral to [facility] and Amy noted acceptance, info on AVS.  RW and BSC ordered from [employer] and Kim to deliver to patient at bedside.  CM will continue to follow as patient progresses with care towards discharge.        Expected Discharge Plan: [place] Barriers to Discharge: Continued Medical Work up  Expected Discharge Plan and Services   Discharge Planning Services: CM Consult Post Acute Care Choice: [place] arrangements for the past 2 months: Single Family Home                 DME Arranged: Bedside commode, Walker rolling DME Agency: [employer] Date DME Agency Contacted: 06/22/22 Time DME Agency Contacted: 1620 Representative spoke with at DME Agency: Maudie Mercury HH Arranged: PT, OT, RN, Disease Management [place] Agency: [place] Date [place]: 06/22/22 Time [place]: 1625 Representative spoke with at [place]: Amy   Social Determinants of Health ([place]) Interventions SDOH Screenings   Food Insecurity: Unknown (06/18/2022)  Tobacco Use: Medium Risk (06/19/2022)    Readmission Risk Interventions     No data to display

## 2022-06-22 NOTE — Progress Notes (Signed)
OT Cancellation Note  Patient Details Name: SHENELL ROGALSKI MRN: [id number] DOB: 1960/11/19   Cancelled Treatment:    Reason Eval/Treat Not Completed: Patient declined, no reason specified Attempted OT session though pt declines to participate until husband present. Contacted husband who plans to be here shortly after 11AM. Will circle back as time allows for OT session.  Layla Maw 06/22/2022, 9:22 AM

## 2022-06-22 NOTE — Progress Notes (Signed)
PROGRESS NOTE    Kim Holland  ZWC:[id number] DOB: 10/13/1960 DOA: 06/17/2022 PCP: Thressa Sheller, MD (Inactive)   Brief Narrative: 62 year old with past medical history significant for alcohol abuse, GERD, hypertension, hyperlipidemia, seizure disorder not on medication, anxiety disorder who presents with confusion, Jaundice, acute liver failure.  She reports she stopped drinking alcohol 3 weeks prior to admission.  She has been drinking heavily for the last year.  Patient admitted with decompensated liver cirrhosis secondary to alcohol use, alcoholic hepatitis.   Assessment & Plan:   Principal Problem:   Acute hepatic failure Active Problems:   Essential hypertension   Seizure (HCC)   GERD (gastroesophageal reflux disease)   Hyperlipidemia   Hyponatremia   Hypokalemia   Leucocytosis   Alcohol abuse   AKI (acute kidney injury) ([place])   Alcoholic cirrhosis of liver with ascites (HCC)   Alcoholic hepatitis with ascites   1-Acute Liver Failure: Likely in the setting of alcohol use disorder -Patient reports stopped drinking alcohol since January 2, 3 weeks ago. -CT abdomen and pelvis: Moderate ascites throughout the abdomen and pelvis. Heterogeneous liver with possible focal lesion in the left lobe of the liver.  Recommend further evaluation with MRI. Retroperitoneal varices. Cholelithiasis. -GI has been consulted, and following, appreciate assistance.  -Maddrey's discrimination score more than 32.  Started on prednisone 1/25. Needs 4 weeks tx -Underwent paracentesis 1/25, negative for SBP.  Continue with lactulose.  LFT trending down.  MRI negative for liver mass.   2-Alcohol Abuse Disorder: Monitor on CIWA.  Thiamine, folic acid.   3-Hypokalemia:  Replaced.  Mg level 2.6 K at 3.5  4-Hyponatremia:  In setting liver failure, component hypovolemia.  On IV  NS Improving.   Heterogeneous liver with possible focal lesion in the left lobe of the liver. MRI: Focal  abnormality along the liver in segment 4 B corresponding to the finding by CT scan does not have aggressive features. No hypervascularity, washout or abnormal diffusion. This could represent an area of fatty sparing as there is some underlying diffuse fatty liver infiltration noted.   Acute Hepatic Encephalopathy;  Ammonia: 56 Continue with lactulose. Change to daily due to diarrhea.  Start rifaximin.   Elevation Troponin:  Denies chest pain.  Cardiology consulted. No further cardia evaluation.  Demand ischemia.   GERD:  Continue with PPI.   History of Seizure Disorder:  Monitor. Not on medications.   HTN; Hold BP medications.   AKI: Continue with IV fluids.   Leukocytosis: CAP, UTI UA with positive nitrates and leukocytes.  Chest x ray: Atelectasis versus infiltrate at right base Started on IV ceftriaxone and azithromycin day 2 WBC higher , likely in setting of prednisone.  Afebrile.    Estimated body mass index is 27.7 kg/m as calculated from the following:   Height as of this encounter: '5\' 5"'$  (1.651 m).   Weight as of this encounter: 75.5 kg.   DVT prophylaxis: SCD for now due to thrombocytopenia.   Code Status: Full code Family Communication: Husband updated 1/29 Disposition Plan:  Status is: Inpatient Remains inpatient appropriate because: management liver failure    Consultants:  GI Cardiology   Procedures:  Paracentesis 1/25 ECHO  Antimicrobials:    Subjective: She appears more awake and conversant today.  She is very weak still.  She has had 3 BM so far this am.   Objective: Vitals:   06/21/22 1009 06/21/22 2048 06/22/22 0603 06/22/22 0905  BP: 132/64 139/85 (!) 144/88 137/84  Pulse: 90 81  82 90  Resp: '16 18 18   '$ Temp: 98.5 F (36.9 C) 97.6 F (36.4 C) 97.6 F (36.4 C) (!) 97.5 F (36.4 C)  TempSrc:  Oral Oral Oral  SpO2: 95% 94% 92% 93%  Weight:      Height:        Intake/Output Summary (Last 24 hours) at 06/22/2022 1354 Last  data filed at 06/22/2022 0900 Gross per 24 hour  Intake 320 ml  Output 0 ml  Net 320 ml    Filed Weights   06/17/22 1640 06/18/22 0538  Weight: 72.6 kg 75.5 kg    Examination:  General exam: Icteric, chronic ill appearing.  Respiratory system: CTA Cardiovascular system: S 1, S 2 RRR Gastrointestinal system: BS present, soft, nt Central nervous system: Alert, follows command Extremities: No edema    Data Reviewed: I have personally reviewed following labs and imaging studies  CBC: Recent Labs  Lab 06/18/22 0424 06/19/22 0958 06/20/22 0249 06/21/22 0811 06/22/22 0300  WBC 15.4* 18.7* 17.3* 18.7* 18.0*  HGB 12.5 13.4 13.0 13.9 13.6  HCT 35.2* 36.6 36.5 39.2 40.1  MCV 95.9 93.8 95.1 96.6 98.5  PLT 131* 135* 132* 152 378    Basic Metabolic Panel: Recent Labs  Lab 06/17/22 2056 06/18/22 0424 06/18/22 1045 06/19/22 0958 06/20/22 0249 06/21/22 0811 06/22/22 0300  NA  --    < > 127* 129* 133* 135 134*  K  --    < > 2.8* 3.8 3.5 3.8 3.7  CL  --    < > 81* 90* 98 102 100  CO2  --    < > '31 28 28 24 26  '$ GLUCOSE  --    < > 129* 142* 121* 79 98  BUN  --    < > 25* 23* 23* 19 17  CREATININE  --    < > 1.20* 1.12* 0.85 0.86 0.90  CALCIUM  --    < > 7.7* 7.8* 7.7* 7.8* 7.8*  MG 2.6*  --   --  2.5*  --   --   --    < > = values in this interval not displayed.    GFR: Estimated Creatinine Clearance: 67.6 mL/min (by C-G formula based on SCr of 0.9 mg/dL). Liver Function Tests: Recent Labs  Lab 06/18/22 0424 06/19/22 0958 06/20/22 0249 06/21/22 0811 06/22/22 0300  AST 145* 165* 125* 154* 162*  ALT 40 48* 44 57* 57*  ALKPHOS 147* 173* 172* 170* 177*  BILITOT 10.6* 10.5* 8.4* 7.9* 6.9*  PROT 4.8* 5.3* 4.9* 5.1* 4.9*  ALBUMIN 1.5* 1.7* <1.5* 1.6* 1.5*    Recent Labs  Lab 06/17/22 2056  LIPASE 55*    Recent Labs  Lab 06/17/22 1749 06/22/22 0856  AMMONIA 57* 52*    Coagulation Profile: Recent Labs  Lab 06/17/22 1750 06/20/22 0811  INR 1.7* 1.7*     Cardiac Enzymes: Recent Labs  Lab 06/18/22 0424  CKTOTAL 49    BNP (last 3 results) No results for input(s): "PROBNP" in the last 8760 hours. HbA1C: No results for input(s): "HGBA1C" in the last 72 hours. CBG: Recent Labs  Lab 06/17/22 1726  GLUCAP 120*    Lipid Profile: No results for input(s): "CHOL", "HDL", "LDLCALC", "TRIG", "CHOLHDL", "LDLDIRECT" in the last 72 hours. Thyroid Function Tests: No results for input(s): "TSH", "T4TOTAL", "FREET4", "T3FREE", "THYROIDAB" in the last 72 hours. Anemia Panel: No results for input(s): "VITAMINB12", "FOLATE", "FERRITIN", "TIBC", "IRON", "RETICCTPCT" in the last 72 hours. Sepsis Labs:  No results for input(s): "PROCALCITON", "LATICACIDVEN" in the last 168 hours.  Recent Results (from the past 240 hour(s))  Respiratory (~20 pathogens) panel by PCR     Status: None   Collection Time: 06/18/22 11:51 AM   Specimen: Nasopharyngeal Swab; Respiratory  Result Value Ref Range Status   Adenovirus NOT DETECTED NOT DETECTED Final   Coronavirus 229E NOT DETECTED NOT DETECTED Final    Comment: (NOTE) The Coronavirus on the Respiratory Panel, DOES NOT test for the novel  Coronavirus (2019 nCoV)    Coronavirus HKU1 NOT DETECTED NOT DETECTED Final   Coronavirus NL63 NOT DETECTED NOT DETECTED Final   Coronavirus OC43 NOT DETECTED NOT DETECTED Final   Metapneumovirus NOT DETECTED NOT DETECTED Final   Rhinovirus / Enterovirus NOT DETECTED NOT DETECTED Final   Influenza A NOT DETECTED NOT DETECTED Final   Influenza B NOT DETECTED NOT DETECTED Final   Parainfluenza Virus 1 NOT DETECTED NOT DETECTED Final   Parainfluenza Virus 2 NOT DETECTED NOT DETECTED Final   Parainfluenza Virus 3 NOT DETECTED NOT DETECTED Final   Parainfluenza Virus 4 NOT DETECTED NOT DETECTED Final   Respiratory Syncytial Virus NOT DETECTED NOT DETECTED Final   Bordetella pertussis NOT DETECTED NOT DETECTED Final   Bordetella Parapertussis NOT DETECTED NOT DETECTED  Final   Chlamydophila pneumoniae NOT DETECTED NOT DETECTED Final   Mycoplasma pneumoniae NOT DETECTED NOT DETECTED Final    Comment: Performed at [facility], [place]. [address], [place], [place] [address]  Culture, body fluid w Gram Stain-bottle     Status: None (Preliminary result)   Collection Time: 06/18/22 12:51 PM   Specimen: Fluid  Result Value Ref Range Status   Specimen Description FLUID PERITONEAL ABDOMEN  Final   Special Requests BOTTLES DRAWN AEROBIC AND ANAEROBIC  Final   Culture   Final    NO GROWTH 4 DAYS Performed at [facility], [place] [address], [place], [place] [address]    Report Status PENDING  Incomplete  Gram stain     Status: None   Collection Time: 06/18/22 12:51 PM   Specimen: Fluid  Result Value Ref Range Status   Specimen Description FLUID PERITONEAL ABDOMEN  Final   Special Requests NONE  Final   Gram Stain   Final    WBC PRESENT, PREDOMINANTLY MONONUCLEAR NO ORGANISMS SEEN CYTOSPIN SMEAR Performed at [facility], [place] [address] [place], [place] [address]    Report Status 06/18/2022 FINAL  Final  Culture, blood (Routine X 2) w Reflex to ID Panel     Status: None (Preliminary result)   Collection Time: 06/18/22  3:50 PM   Specimen: BLOOD  Result Value Ref Range Status   Specimen Description BLOOD SITE NOT SPECIFIED  Final   Special Requests   Final    BOTTLES DRAWN AEROBIC AND ANAEROBIC Blood Culture results may not be optimal due to an excessive volume of blood received in culture bottles   Culture   Final    NO GROWTH 4 DAYS Performed at [facility], [place] [address] [place], [place] [address]    Report Status PENDING  Incomplete  Culture, blood (Routine X 2) w Reflex to ID Panel     Status: None (Preliminary result)   Collection Time: 06/18/22  3:50 PM   Specimen: BLOOD  Result Value Ref Range Status   Specimen Description BLOOD SITE NOT SPECIFIED  Final   Special Requests   Final    BOTTLES DRAWN AEROBIC  AND ANAEROBIC  Blood Culture results may not be optimal due to an inadequate volume of blood received in culture bottles   Culture   Final    NO GROWTH 4 DAYS Performed at [place] [address] [place], [place] [address]    Report Status PENDING  Incomplete  Urine Culture (for pregnant, neutropenic or urologic patients or patients with an indwelling urinary catheter)     Status: None   Collection Time: 06/20/22  4:18 PM   Specimen: Urine, Clean Catch  Result Value Ref Range Status   Specimen Description URINE, CLEAN CATCH  Final   Special Requests NONE  Final   Culture   Final    NO GROWTH Performed at [facility], [place] [address] [place], [place] [address]    Report Status 06/21/2022 FINAL  Final         Radiology Studies: No results found.      Scheduled Meds:  folic acid  1 mg Oral Daily   Gerhardt's butt cream   Topical BID   [START ON 06/23/2022] lactulose  10 g Oral Daily   multivitamin with minerals  1 tablet Oral Daily   pantoprazole  40 mg Oral Daily   prednisoLONE  40 mg Oral Daily   rifaximin  550 mg Oral BID   thiamine  100 mg Oral Daily   Continuous Infusions:  azithromycin 500 mg (06/22/22 1058)   cefTRIAXone (ROCEPHIN)  IV 2 g (06/22/22 1002)     LOS: 5 days    Time spent: 35 minutes    Charletta Voight A Liviah Cake, MD [facility]   If 7PM-7AM, please contact night-coverage [email or web address]  06/22/2022, 1:54 PM

## 2022-06-23 DIAGNOSIS — K7011 Alcoholic hepatitis with ascites: Secondary | ICD-10-CM | POA: Diagnosis not present

## 2022-06-23 DIAGNOSIS — K7031 Alcoholic cirrhosis of liver with ascites: Secondary | ICD-10-CM | POA: Diagnosis not present

## 2022-06-23 DIAGNOSIS — K7200 Acute and subacute hepatic failure without coma: Secondary | ICD-10-CM | POA: Diagnosis not present

## 2022-06-23 LAB — CULTURE, BODY FLUID W GRAM STAIN -BOTTLE: Culture: NO GROWTH

## 2022-06-23 LAB — COMPREHENSIVE METABOLIC PANEL
ALT: 68 U/L — ABNORMAL HIGH (ref 0–44)
AST: 182 U/L — ABNORMAL HIGH (ref 15–41)
Albumin: 1.7 g/dL — ABNORMAL LOW (ref 3.5–5.0)
Alkaline Phosphatase: 205 U/L — ABNORMAL HIGH (ref 38–126)
Anion gap: 10 (ref 5–15)
BUN: 20 mg/dL (ref 6–20)
CO2: 23 mmol/L (ref 22–32)
Calcium: 8 mg/dL — ABNORMAL LOW (ref 8.9–10.3)
Chloride: 102 mmol/L (ref 98–111)
Creatinine, Ser: 0.95 mg/dL (ref 0.44–1.00)
GFR, Estimated: >60 mL/min (ref >60)
Glucose, Bld: 97 mg/dL (ref 70–99)
Potassium: 3.3 mmol/L — ABNORMAL LOW (ref 3.5–5.1)
Sodium: 135 mmol/L (ref 135–145)
Total Bilirubin: 7.8 mg/dL — ABNORMAL HIGH (ref 0.3–1.2)
Total Protein: 5.3 g/dL — ABNORMAL LOW (ref 6.5–8.1)

## 2022-06-23 LAB — CBC
HCT: 41.6 % (ref 36.0–46.0)
Hemoglobin: 14.7 g/dL (ref 12.0–15.0)
MCH: 33.9 pg (ref 26.0–34.0)
MCHC: 35.3 g/dL (ref 30.0–36.0)
MCV: 95.9 fL (ref 80.0–100.0)
Platelets: 184 10*3/uL (ref 150–400)
RBC: 4.34 MIL/uL (ref 3.87–5.11)
RDW: 14.4 % (ref 11.5–15.5)
WBC: 21.5 10*3/uL — ABNORMAL HIGH (ref 4.0–10.5)
nRBC: 0 % (ref 0.0–0.2)

## 2022-06-23 LAB — CULTURE, BLOOD (ROUTINE X 2)
Culture: NO GROWTH
Culture: NO GROWTH

## 2022-06-23 MED ORDER — LACTATED RINGERS IV SOLN: INTRAVENOUS | Status: DC

## 2022-06-23 MED ORDER — BIOTENE DRY MOUTH MT LIQD: 15.0000 mL | OROMUCOSAL | Status: DC | PRN

## 2022-06-23 MED ORDER — POTASSIUM CHLORIDE CRYS ER 20 MEQ PO TBCR
40.0000 meq | EXTENDED_RELEASE_TABLET | ORAL | Status: AC
  Administered 2022-06-23 (×2): 40 meq via ORAL
  Filled 2022-06-23 (×2): qty 2

## 2022-06-23 NOTE — Progress Notes (Signed)
PROGRESS NOTE    Kim Holland  DUK:[id number] DOB: [birth year] DOA: 06/17/2022 PCP: [name], FNP   Brief Narrative: 62 year old with past medical history significant for alcohol abuse, GERD, hypertension, hyperlipidemia, seizure disorder not on medication, anxiety disorder who presents with confusion, Jaundice, acute liver failure.  She reports she stopped drinking alcohol 3 weeks prior to admission.  She has been drinking heavily for the last year.  Patient admitted with decompensated liver cirrhosis secondary to alcohol use, alcoholic hepatitis. Treating with IV antibiotics for  presume PNA, UTI. She was started on prednisolone for alcoholic hepatitis discrimination score more than 32. GI was following. Leukocytosis increase today, plan to resume IV fluids, monitor for fever. Denies worsening cough. Plan to repeat WBC in am if improved plan is to discharge home.    Assessment & Plan:   Principal Problem:   Acute hepatic failure Active Problems:   Essential hypertension   Seizure (HCC)   GERD (gastroesophageal reflux disease)   Hyperlipidemia   Hyponatremia   Hypokalemia   Leucocytosis   Alcohol abuse   AKI (acute kidney injury) ([place])   Alcoholic cirrhosis of liver with ascites (HCC)   Alcoholic hepatitis with ascites   1-Acute Liver Failure: Likely in the setting of alcohol use disorder -Patient reports stopped drinking alcohol since January 2, 3 weeks ago. -CT abdomen and pelvis: Moderate ascites throughout the abdomen and pelvis. Heterogeneous liver with possible focal lesion in the left lobe of the liver.  Recommend further evaluation with MRI. Retroperitoneal varices. Cholelithiasis. -GI has been consulted, and following, appreciate assistance.  -Maddrey's discrimination score more than 32.  Started on prednisone 1/25. Needs 4 weeks tx -Underwent paracentesis 1/25, negative for SBP.  -started on lactulose. Had multiples BM yesterday. Hold lactulose and monitor  diarrhea, and leukocytosis.  LFT mildly increase today--- repeat tomorrow.  MRI negative for liver mass.   2-Alcohol Abuse Disorder: Monitor on CIWA.  Thiamine, folic acid.   3-Hypokalemia:  Replete orally.   4-Hyponatremia:  In setting liver failure, component hypovolemia.  On IV  NS Improving.   Heterogeneous liver with possible focal lesion in the left lobe of the liver. MRI: Focal abnormality along the liver in segment 4 B corresponding to the finding by CT scan does not have aggressive features. No hypervascularity, washout or abnormal diffusion. This could represent an area of fatty sparing as there is some underlying diffuse fatty liver infiltration noted.   Acute Hepatic Encephalopathy;  Ammonia: 56 Started lactulose. Hold today due to diarrhea Started  rifaximin.   Elevation Troponin:  Denies chest pain.  Cardiology consulted. No further cardia evaluation.  Demand ischemia.   GERD:  Continue with PPI.   History of Seizure Disorder:  Monitor. Not on medications.   HTN; Hold BP medications.   AKI: Continue with IV fluids.   Leukocytosis: CAP, UTI UA with positive nitrates and leukocytes.  Chest x ray: Atelectasis versus infiltrate at right base Started on IV ceftriaxone and azithromycin day 2 Also leukocytosis could be related to steroids.  Afebrile.  WBC increase to 21, resume IV fluids. And monitor for fever.   Estimated body mass index is 27.7 kg/m as calculated from the following:   Height as of this encounter: '5\' 5"'$  (1.651 m).   Weight as of this encounter: 75.5 kg.   DVT prophylaxis: SCD for now due to thrombocytopenia.   Code Status: Full code Family Communication: Husband updated 1/29 Disposition Plan:  Status is: Inpatient Remains inpatient appropriate because: management  liver failure    Consultants:  GI Cardiology   Procedures:  Paracentesis 1/25 ECHO  Antimicrobials:    Subjective: She is alert, denies worsening cough. She  had 6 BM yesterday. One BM so far today .   Objective: Vitals:   06/22/22 1659 06/22/22 2111 06/23/22 0551 06/23/22 0955  BP: (!) 142/88 130/79 139/74 137/72  Pulse: 93 95 91 88  Resp:   18 18  Temp: 97.7 F (36.5 C) 98 F (36.7 C) (!) 97.5 F (36.4 C) 98 F (36.7 C)  TempSrc: Oral Oral Oral Oral  SpO2: 95% 95% 94% 96%  Weight:      Height:        Intake/Output Summary (Last 24 hours) at 06/23/2022 1503 Last data filed at 06/23/2022 0900 Gross per 24 hour  Intake 680 ml  Output 0 ml  Net 680 ml    Filed Weights   06/17/22 1640 06/18/22 0538  Weight: 72.6 kg 75.5 kg    Examination:  General exam: Icteric , NAD, chronic ill appearing.  Respiratory system: CTA Cardiovascular system: S 1, S 2 RRR Gastrointestinal system: BS present, soft, nt Central nervous system: Alert, follows command Extremities: No edema    Data Reviewed: I have personally reviewed following labs and imaging studies  CBC: Recent Labs  Lab 06/19/22 0958 06/20/22 0249 06/21/22 0811 06/22/22 0300 06/23/22 0900  WBC 18.7* 17.3* 18.7* 18.0* 21.5*  HGB 13.4 13.0 13.9 13.6 14.7  HCT 36.6 36.5 39.2 40.1 41.6  MCV 93.8 95.1 96.6 98.5 95.9  PLT 135* 132* 152 166 742    Basic Metabolic Panel: Recent Labs  Lab 06/17/22 2056 06/18/22 0424 06/19/22 0958 06/20/22 0249 06/21/22 0811 06/22/22 0300 06/23/22 0900  NA  --    < > 129* 133* 135 134* 135  K  --    < > 3.8 3.5 3.8 3.7 3.3*  CL  --    < > 90* 98 102 100 102  CO2  --    < > '28 28 24 26 23  '$ GLUCOSE  --    < > 142* 121* 79 98 97  BUN  --    < > 23* 23* '19 17 20  '$ CREATININE  --    < > 1.12* 0.85 0.86 0.90 0.95  CALCIUM  --    < > 7.8* 7.7* 7.8* 7.8* 8.0*  MG 2.6*  --  2.5*  --   --   --   --    < > = values in this interval not displayed.    GFR: Estimated Creatinine Clearance: 64 mL/min (by C-G formula based on SCr of 0.95 mg/dL). Liver Function Tests: Recent Labs  Lab 06/19/22 0958 06/20/22 0249 06/21/22 0811  06/22/22 0300 06/23/22 0900  AST 165* 125* 154* 162* 182*  ALT 48* 44 57* 57* 68*  ALKPHOS 173* 172* 170* 177* 205*  BILITOT 10.5* 8.4* 7.9* 6.9* 7.8*  PROT 5.3* 4.9* 5.1* 4.9* 5.3*  ALBUMIN 1.7* <1.5* 1.6* 1.5* 1.7*    Recent Labs  Lab 06/17/22 2056  LIPASE 55*    Recent Labs  Lab 06/17/22 1749 06/22/22 0856  AMMONIA 57* 52*    Coagulation Profile: Recent Labs  Lab 06/17/22 1750 06/20/22 0811  INR 1.7* 1.7*    Cardiac Enzymes: Recent Labs  Lab 06/18/22 0424  CKTOTAL 49    BNP (last 3 results) No results for input(s): "PROBNP" in the last 8760 hours. HbA1C: No results for input(s): "HGBA1C" in the last 72 hours.  CBG: Recent Labs  Lab 06/17/22 1726  GLUCAP 120*    Lipid Profile: No results for input(s): "CHOL", "HDL", "LDLCALC", "TRIG", "CHOLHDL", "LDLDIRECT" in the last 72 hours. Thyroid Function Tests: No results for input(s): "TSH", "T4TOTAL", "FREET4", "T3FREE", "THYROIDAB" in the last 72 hours. Anemia Panel: No results for input(s): "VITAMINB12", "FOLATE", "FERRITIN", "TIBC", "IRON", "RETICCTPCT" in the last 72 hours. Sepsis Labs: No results for input(s): "PROCALCITON", "LATICACIDVEN" in the last 168 hours.  Recent Results (from the past 240 hour(s))  Respiratory (~20 pathogens) panel by PCR     Status: None   Collection Time: 06/18/22 11:51 AM   Specimen: Nasopharyngeal Swab; Respiratory  Result Value Ref Range Status   Adenovirus NOT DETECTED NOT DETECTED Final   Coronavirus 229E NOT DETECTED NOT DETECTED Final    Comment: (NOTE) The Coronavirus on the Respiratory Panel, DOES NOT test for the novel  Coronavirus (2019 nCoV)    Coronavirus HKU1 NOT DETECTED NOT DETECTED Final   Coronavirus NL63 NOT DETECTED NOT DETECTED Final   Coronavirus OC43 NOT DETECTED NOT DETECTED Final   Metapneumovirus NOT DETECTED NOT DETECTED Final   Rhinovirus / Enterovirus NOT DETECTED NOT DETECTED Final   Influenza A NOT DETECTED NOT DETECTED Final    Influenza B NOT DETECTED NOT DETECTED Final   Parainfluenza Virus 1 NOT DETECTED NOT DETECTED Final   Parainfluenza Virus 2 NOT DETECTED NOT DETECTED Final   Parainfluenza Virus 3 NOT DETECTED NOT DETECTED Final   Parainfluenza Virus 4 NOT DETECTED NOT DETECTED Final   Respiratory Syncytial Virus NOT DETECTED NOT DETECTED Final   Bordetella pertussis NOT DETECTED NOT DETECTED Final   Bordetella Parapertussis NOT DETECTED NOT DETECTED Final   Chlamydophila pneumoniae NOT DETECTED NOT DETECTED Final   Mycoplasma pneumoniae NOT DETECTED NOT DETECTED Final    Comment: Performed at [facility], [place]. [address] [place], [place] [address]  Culture, body fluid w Gram Stain-bottle     Status: None   Collection Time: 06/18/22 12:51 PM   Specimen: Fluid  Result Value Ref Range Status   Specimen Description FLUID PERITONEAL ABDOMEN  Final   Special Requests BOTTLES DRAWN AEROBIC AND ANAEROBIC  Final   Culture   Final    NO GROWTH 5 DAYS Performed at [facility], [place] [address] [place], [place] [address]    Report Status 06/23/2022 FINAL  Final  Gram stain     Status: None   Collection Time: 06/18/22 12:51 PM   Specimen: Fluid  Result Value Ref Range Status   Specimen Description FLUID PERITONEAL ABDOMEN  Final   Special Requests NONE  Final   Gram Stain   Final    WBC PRESENT, PREDOMINANTLY MONONUCLEAR NO ORGANISMS SEEN CYTOSPIN SMEAR Performed at [facility], [place] [address], [place], [place] [address]    Report Status 06/18/2022 FINAL  Final  Culture, blood (Routine X 2) w Reflex to ID Panel     Status: None   Collection Time: 06/18/22  3:50 PM   Specimen: BLOOD  Result Value Ref Range Status   Specimen Description BLOOD SITE NOT SPECIFIED  Final   Special Requests   Final    BOTTLES DRAWN AEROBIC AND ANAEROBIC Blood Culture results may not be optimal due to an excessive volume of blood received in culture bottles   Culture   Final    NO GROWTH 5 DAYS Performed  at [facility], [place] [address] [place], [place] [address]    Report  Status 06/23/2022 FINAL  Final  Culture, blood (Routine X 2) w Reflex to ID Panel     Status: None   Collection Time: 06/18/22  3:50 PM   Specimen: BLOOD  Result Value Ref Range Status   Specimen Description BLOOD SITE NOT SPECIFIED  Final   Special Requests   Final    BOTTLES DRAWN AEROBIC AND ANAEROBIC Blood Culture results may not be optimal due to an inadequate volume of blood received in culture bottles   Culture   Final    NO GROWTH 5 DAYS Performed at [facility], [place] [address] [place], [place] [address]    Report Status 06/23/2022 FINAL  Final  Urine Culture (for pregnant, neutropenic or urologic patients or patients with an indwelling urinary catheter)     Status: None   Collection Time: 06/20/22  4:18 PM   Specimen: Urine, Clean Catch  Result Value Ref Range Status   Specimen Description URINE, CLEAN CATCH  Final   Special Requests NONE  Final   Culture   Final    NO GROWTH Performed at [facility], [place] [address] [place], [place] [address]    Report Status 06/21/2022 FINAL  Final         Radiology Studies: No results found.      Scheduled Meds:  folic acid  1 mg Oral Daily   Gerhardt's butt cream   Topical BID   lactulose  10 g Oral Daily   multivitamin with minerals  1 tablet Oral Daily   pantoprazole  40 mg Oral Daily   potassium chloride  40 mEq Oral Q4H   prednisoLONE  40 mg Oral Daily   rifaximin  550 mg Oral BID   thiamine  100 mg Oral Daily   Continuous Infusions:  cefTRIAXone (ROCEPHIN)  IV 2 g (06/23/22 0835)   lactated ringers 100 mL/hr at 06/23/22 1350     LOS: 6 days    Time spent: 35 minutes    Eithan Beagle A Brittan Mapel, MD [facility]   If 7PM-7AM, please contact night-coverage [email or web address]  06/23/2022, 3:03 PM

## 2022-06-23 NOTE — Progress Notes (Signed)
Physical Therapy Treatment Patient Details Name: Kim Holland MRN: [id number] DOB: 28-Dec-1960 Today's Date: 06/23/2022   History of Present Illness 62 y/o F admitted for AMS and changes in color of skin and eyes. Pt diagnosed with acute hepatic failure. PMHx: GERD, HTN, HLD, seizure disorder, anxiety, alcohol abuse but quit drinking ~3 weeks ago.    PT Comments    Continuing work on functional mobility and activity tolerance;  Session focused on transfer training and starting progressive ambulation; Pt hesitant to participate initially, but responds well to encouragement; Much improved sit<>stand transfers when able to use armrests to push off; Walked short in-room distance today with RW; Anticipate she will be able to walk in the hallway next session; Possible dc tomorrow -- will try to see early for porgressive amb in prep for getting home  Recommendations for follow up therapy are one component of a multi-disciplinary discharge planning process, led by the attending physician.  Recommendations may be updated based on patient status, additional functional criteria and insurance authorization.  Follow Up Recommendations  Home health PT     Assistance Recommended at Discharge Frequent or constant Supervision/Assistance  Patient can return home with the following A little help with walking and/or transfers;Assistance with cooking/housework;Direct supervision/assist for medications management;Direct supervision/assist for financial management;Assist for transportation;Help with stairs or ramp for entrance   Equipment Recommendations  Rolling walker (2 wheels) (delivered to room)    Recommendations for Other Services       Precautions / Restrictions Precautions Precautions: Fall     Mobility  Bed Mobility Overal bed mobility: Needs Assistance Bed Mobility: Rolling, Sidelying to Sit   Sidelying to sit: Min assist       General bed mobility comments: use of bedrails, light Min  handheld  A to pull to sit    Transfers Overall transfer level: Needs assistance Equipment used: Rolling walker (2 wheels) Transfers: Sit to/from Stand, Bed to chair/wheelchair/BSC Sit to Stand: Mod assist, Min guard   Step pivot transfers: Min guard       General transfer comment: Mod assist to stand from EOB; pivot steps with RW bed to bscMinguard assist to stand from [facility] using armrests    Ambulation/Gait Ambulation/Gait assistance: Min guard (with physical contact) Gait Distance (Feet): 8 Feet Assistive device: Rolling walker (2 wheels) Gait Pattern/deviations: Step-through pattern, Decreased step length - right, Decreased step length - left       General Gait Details: Overall steady with RW; Needs encouragement to try   Stairs             Wheelchair Mobility    Modified Rankin (Stroke Patients Only)       Balance     Sitting balance-Leahy Scale: Fair       Standing balance-Leahy Scale: Poor                              Cognition Arousal/Alertness: Awake/alert Behavior During Therapy: WFL for tasks assessed/performed, Flat affect Overall Cognitive Status: Impaired/Different from baseline                               Problem Solving: Decreased initiation General Comments: can do more than pt believes she can; limited by anxiety at times but responds well to encouragement        Exercises      General Comments  Pertinent Vitals/Pain Pain Assessment Pain Assessment: Faces Faces Pain Scale: Hurts little more Pain Location: bottom, stomach Pain Descriptors / Indicators: Grimacing, Guarding Pain Intervention(s): Monitored during session    Home Living                          Prior Function            PT Goals (current goals can now be found in the care plan section) Acute Rehab PT Goals Patient Stated Goal: get better PT Goal Formulation: With patient Time For Goal Achievement:  07/04/22 Potential to Achieve Goals: Good Progress towards PT goals: Progressing toward goals (slowly)    Frequency    Min 3X/week      PT Plan Current plan remains appropriate    Co-evaluation              AM-PAC PT "6 Clicks" Mobility   Outcome Measure  Help needed turning from your back to your side while in a flat bed without using bedrails?: None Help needed moving from lying on your back to sitting on the side of a flat bed without using bedrails?: A Little Help needed moving to and from a bed to a chair (including a wheelchair)?: A Little Help needed standing up from a chair using your arms (e.g., wheelchair or bedside chair)?: A Little Help needed to walk in hospital room?: A Little Help needed climbing 3-5 steps with a railing? : A Lot 6 Click Score: 18    End of Session Equipment Utilized During Treatment: Gait belt Activity Tolerance: Patient tolerated treatment well Patient left: in chair;with call bell/phone within reach;with chair alarm set Nurse Communication: Mobility status PT Visit Diagnosis: Unsteadiness on feet (R26.81);Muscle weakness (generalized) (M62.81)     Time: 1410-1435 PT Time Calculation (min) (ACUTE ONLY): 25 min  Charges:  $Gait Training: 8-22 mins $Therapeutic Activity: 8-22 mins                     Roney Marion, [facility] [phone]    Colletta [place] 06/23/2022, 3:28 PM

## 2022-06-24 ENCOUNTER — Other Ambulatory Visit (HOSPITAL_COMMUNITY): Payer: Self-pay

## 2022-06-24 DIAGNOSIS — K7290 Hepatic failure, unspecified without coma: Secondary | ICD-10-CM | POA: Diagnosis not present

## 2022-06-24 DIAGNOSIS — K7460 Unspecified cirrhosis of liver: Secondary | ICD-10-CM

## 2022-06-24 LAB — CBC
HCT: 36.5 % (ref 36.0–46.0)
Hemoglobin: 13 g/dL (ref 12.0–15.0)
MCH: 34.6 pg — ABNORMAL HIGH (ref 26.0–34.0)
MCHC: 35.6 g/dL (ref 30.0–36.0)
MCV: 97.1 fL (ref 80.0–100.0)
Platelets: 154 10*3/uL (ref 150–400)
RBC: 3.76 MIL/uL — ABNORMAL LOW (ref 3.87–5.11)
RDW: 14.5 % (ref 11.5–15.5)
WBC: 18.4 10*3/uL — ABNORMAL HIGH (ref 4.0–10.5)
nRBC: 0 % (ref 0.0–0.2)

## 2022-06-24 LAB — COMPREHENSIVE METABOLIC PANEL
ALT: 69 U/L — ABNORMAL HIGH (ref 0–44)
AST: 148 U/L — ABNORMAL HIGH (ref 15–41)
Albumin: 1.5 g/dL — ABNORMAL LOW (ref 3.5–5.0)
Alkaline Phosphatase: 189 U/L — ABNORMAL HIGH (ref 38–126)
Anion gap: 8 (ref 5–15)
BUN: 17 mg/dL (ref 6–20)
CO2: 22 mmol/L (ref 22–32)
Calcium: 7.9 mg/dL — ABNORMAL LOW (ref 8.9–10.3)
Chloride: 102 mmol/L (ref 98–111)
Creatinine, Ser: 0.78 mg/dL (ref 0.44–1.00)
GFR, Estimated: >60 mL/min (ref >60)
Glucose, Bld: 105 mg/dL — ABNORMAL HIGH (ref 70–99)
Potassium: 3.9 mmol/L (ref 3.5–5.1)
Sodium: 132 mmol/L — ABNORMAL LOW (ref 135–145)
Total Bilirubin: 6.7 mg/dL — ABNORMAL HIGH (ref 0.3–1.2)
Total Protein: 4.7 g/dL — ABNORMAL LOW (ref 6.5–8.1)

## 2022-06-24 LAB — CYTOLOGY - NON PAP

## 2022-06-24 MED ORDER — PANTOPRAZOLE SODIUM 40 MG PO TBEC
40.0000 mg | DELAYED_RELEASE_TABLET | Freq: Every day | ORAL | 2 refills | Status: AC
  Filled 2022-06-24: qty 30, 30d supply, fill #0

## 2022-06-24 MED ORDER — PREDNISOLONE 5 MG PO TABS
40.0000 mg | ORAL_TABLET | Freq: Every day | ORAL | 0 refills | Status: DC
  Filled 2022-06-24: qty 168, 21d supply, fill #0

## 2022-06-24 MED ORDER — LACTULOSE 10 GM/15ML PO SOLN
10.0000 g | Freq: Every day | ORAL | 2 refills | Status: AC
  Filled 2022-06-24: qty 450, 30d supply, fill #0

## 2022-06-24 MED ORDER — PREDNISONE 10 MG PO TABS
40.0000 mg | ORAL_TABLET | Freq: Every day | ORAL | 0 refills | Status: DC
  Filled 2022-06-24: qty 84, 21d supply, fill #0

## 2022-06-24 MED ORDER — RIFAXIMIN 550 MG PO TABS
550.0000 mg | ORAL_TABLET | Freq: Two times a day (BID) | ORAL | 2 refills | Status: AC
  Filled 2022-06-24: qty 60, 30d supply, fill #0

## 2022-06-24 NOTE — Discharge Summary (Signed)
Physician Discharge Summary  Kim Kim IOE:[id number] DOB: 07-25-1960 DOA: 06/17/2022  PCP: Kim Commons, FNP  Admit date: 06/17/2022 Discharge date: 06/24/2022  Admitted From: Home Disposition: Home  Recommendations for Outpatient Follow-up:  Follow up with PCP in 1-2 weeks Follow-up with [facility] in 2-3 weeks Continue prednisone 40 mg p.o. daily for 4 weeks per GI recommendations Started on rifaximin and lactulose for hepatic encephalopathy Please obtain CMP in one week Continue to encourage alcohol cessation  Home Health: PT/OT Equipment/Devices: Rolling walker, bedside commode  Discharge Condition: Stable CODE STATUS: Full code Diet recommendation: Heart healthy/low-salt diet  History of present illness:  Kim Kim is a 62 year old female with past medical history significant for EtOH abuse, HTN, HLD, GERD, seizure disorder not currently on medication, anxiety who presented to [facility] ED on 1/24 with confusion.  Patient reports stopped drinking alcohol roughly 3 weeks prior to admission and endorses heavy drinking over the last year.  Workup in the ED concerning for acute liver failure/decompensated cirrhosis.  Also concern for pneumonia and UTI.  Gastroenterology was consulted.  TRH consulted for admission for further evaluation and management of decompensated cirrhosis in the setting of hepatic encephalopathy.  Hospital course:  Hepatic encephalopathy Decompensated cirrhosis Pain presenting to ED with confusion.  Noted to have an ammonia level of 56.  Gastroenterology was consulted and followed during hospital course.  Patient was started on lactulose and rifaximin with improvement of encephalopathy now mental status back at her baseline.  Was also started on prednisolone 40 mg p.o. daily and GI recommends 4 weeks of treatment for elevated Madrey discriminant function.  Underwent paracentesis on 1/25 with findings negative for SBP.   Continue rifaximin 550 mg p.o. twice daily and lactulose 10 g p.o. daily.  Goal bowel movements 2-3/day.  Continue to encourage complete abstinence from alcohol.  Discontinued home pravastatin due to elevated LFTs.  Outpatient follow-[facility] with [facility].  Recommend repeat CMP 1 week.  Elevated troponin High sensitive troponin elevated at 303 followed by 206 followed by 172.  EKG with nonspecific ST/T wave changes.  Patient denies chest pain.  TTE with LVEF 60 to 65%, no LV regional wall motion abnormalities.  Patient was seen by cardiology with no further recommendations and likely secondary to type II demand ischemia.  Community-acquired pneumonia Concern for urinary tract infection Patient completed antibiotic course with azithromycin and ceftriaxone during hospitalization.  Urinalysis was consistent with UTI but urine culture with no growth.  Acute renal failure Creatinine elevated 1.25 on admission.  Supported with IV fluid hydration with improvement of creatinine to 0.78 at time of discharge.  Hypokalemia Repleted during hospitalization  Hyponatremia Etiology likely secondary to liver failure, component of hypovolemia.  Improved with IV fluid hydration.  Repeat CMP 1 week.  Hyperlipidemia:  Pravastatin discontinued due to elevated LFTs.  Outpatient follow-up PCP, recommend repeat CMP 1 week.  History of seizure disorder Currently not on medications.  GERD: Continue Protonix 40 mg p.o. daily  Essential hypertension Previously on amlodipine and HCTZ, discontinued as patient's blood pressure has been well-controlled off of antihypertensive medications during hospitalization.  Outpatient follow-up with PCP.  Heterogeneous liver with possible focal lesion in the left lobe of the liver. MRI: Focal abnormality along the liver in segment 4 B corresponding to the finding by CT scan does not have aggressive features. No hypervascularity, washout or abnormal diffusion.  This could represent an area of fatty sparing as there is some  underlying diffuse fatty liver infiltration noted.     Discharge Diagnoses:  Principal Problem:   Acute hepatic failure Active Problems:   Essential hypertension   Seizure (HCC)   GERD (gastroesophageal reflux disease)   Hyperlipidemia   Hyponatremia   Hypokalemia   Leucocytosis   Alcohol abuse   AKI (acute kidney injury) ([place])   Alcoholic cirrhosis of liver with ascites (HCC)   Alcoholic hepatitis with ascites    Discharge Instructions  Discharge Instructions     Call MD for:  difficulty breathing, headache or visual disturbances   Complete by: As directed    Call MD for:  extreme fatigue   Complete by: As directed    Call MD for:  persistant dizziness or light-headedness   Complete by: As directed    Call MD for:  persistant nausea and vomiting   Complete by: As directed    Call MD for:  severe uncontrolled pain   Complete by: As directed    Call MD for:  temperature >100.4   Complete by: As directed    Diet - low sodium heart healthy   Complete by: As directed    Increase activity slowly   Complete by: As directed       Allergies as of 06/24/2022       Reactions   Latex    CAUSES HIVES,SWELLING   Doxycycline Other (See Comments)   Penicillin G Sodium Other (See Comments)   Ampicillin Rash        Medication List     STOP taking these medications    ALPRAZolam 0.25 MG tablet Commonly known as: Xanax   amLODipine 5 MG tablet Commonly known as: NORVASC   hydrochlorothiazide 12.5 MG capsule Commonly known as: MICROZIDE   pravastatin 10 MG tablet Commonly known as: PRAVACHOL       TAKE these medications    cyanocobalamin 1000 MCG tablet Commonly known as: VITAMIN B12 Take 1,000 mcg by mouth daily.   EpiPen 2-Pak 0.3 mg/0.3 mL Soaj injection Generic drug: EPINEPHrine EpiPen 2-Pak 0.3 mg/0.3 mL injection, auto-injector  use as directed by prescriber   escitalopram 5 MG  tablet Commonly known as: LEXAPRO Take 5 mg by mouth daily.   hydrOXYzine 25 MG tablet Commonly known as: ATARAX Take 25 mg by mouth 3 (three) times daily.   lactulose 10 GM/15ML solution Commonly known as: CHRONULAC Take 15 mLs (10 g total) by mouth daily. Ensure 2-3 soft bowel movements daily Start taking on: June 25, 2022   Melatonin 10 MG Tbcr Take 10 capsules by mouth at bedtime.   pantoprazole 40 MG tablet Commonly known as: PROTONIX Take 1 tablet (40 mg total) by mouth daily. Start taking on: June 25, 2022   prednisoLONE 5 MG Tabs tablet Take 8 tablets (40 mg total) by mouth daily for 21 days. Start taking on: June 25, 2022   rifaximin 550 MG Tabs tablet Commonly known as: XIFAXAN Take 1 tablet (550 mg total) by mouth 2 (two) times daily.               Durable Medical Equipment  (From admission, onward)           Start     Ordered   06/22/22 1634  For home use only DME Walker rolling  Once       Question Answer Comment  Walker: With 5 Inch Wheels   Patient needs a walker to treat with the following condition Gait instability  06/22/22 1634   06/22/22 1633  For home use only DME Bedside commode  Once       Comments: Patient is confined to one level of the home environment and there is no toilet on that level.  Question:  Patient needs a bedside commode to treat with the following condition  Answer:  Weakness   06/22/22 1634            Follow-up Information     [facility] Follow up.   Specialty: Rehabilitation Why: Someone will call you to schedule first home visit. If you have not received a call after two days of discharging home, call their number listed. If no one comes to assess, call Case Manager at [phone]. Contact information: [place]. [place] [address] [phone]        Kim Kim, [place]. Schedule an appointment as soon as possible for a  visit in 1 week(s).   Specialty: Internal Medicine Contact information: [address] [place] [address] [place], [place] Schedule an appointment as soon as possible for a visit.   Contact information: [address] [place] [address] [phone]                Allergies  Allergen Reactions   Latex     CAUSES HIVES,SWELLING   Doxycycline Other (See Comments)   Penicillin G Sodium Other (See Comments)   Ampicillin Rash    Consultations: Cardiology Gastroenterology   Procedures/Studies: MR ABDOMEN W WO CONTRAST  Result Date: 06/19/2022 CLINICAL DATA:  Pancreatitis.  Jaundice.  New liver lesion. EXAM: MRI ABDOMEN WITHOUT AND WITH CONTRAST TECHNIQUE: Multiplanar multisequence MR imaging of the abdomen was performed both before and after the administration of intravenous contrast. CONTRAST:  7.33m GADAVIST GADOBUTROL 1 MMOL/ML IV SOLN COMPARISON:  CT 06/17/2022 FINDINGS: Lower chest: Trace pleural fluid. There is some bandlike areas of abnormal signal along the lung bases. Increased from the prior exam. Atelectasis versus infiltrate. Hepatobiliary: There is a nodular heterogeneous appearing liver consistent with chronic liver disease. Areas of signal dropout as well consistent with components of fatty liver infiltration. Liver is micronodular. There are some areas of interstitial enhancement on the most delayed consistent with components of some fibrosis. Specifically in segment 4B on the prior CT scan with some low-density which is ill-defined. This is again seen today involving segment 4B. On series 902, image 63 this measures 3.4 by 2.9 cm and cephalocaudal on series 10, image 68 3.2 cm. This abuts the gallbladder fossa. Areas somewhat low in signal on T2. On precontrast T1 slightly bright in signal compared to adjacent liver. The area is not show arterial enhancement but more slow progressive enhancement on the  dynamic. This could represent an area fatty sparing rather than a true intrinsic lesion with the lack of arterial enhancement or washout. No true hypervascular lesions identified in the liver at this time No biliary ductal dilatation. The gallbladder however is dilated with stones. In addition the gallbladder wall is thickened and irregular. Patchy enhancement. Please correlate for developing acute cholecystitis. Pancreas: Pancreas again shows areas of interstitial edema. No pancreatic dilatation. Preserved pancreatic parenchymal enhancement. Spleen: Spleen is nonenlarged. Preserved enhancement and T2 signal. Adrenals/Urinary Tract: Adrenal glands are preserved. No enhancing renal mass or collecting system dilatation. Lobular contour to the right kidney. Stomach/Bowel: Stomach is collapsed. The visualized small large bowel is nondilated. There are some  subtle areas of wall thickening particularly along the right side of the colon. Nonspecific in the presence of ascites and chronic liver disease. Vascular/Lymphatic: Normal caliber aorta and IVC. There is some prominent vessels in the retroperitoneum with some varices in the left upper quadrant. The main portal vein is patent but small. Diameter at the liver hilum approaches 8 mm in diameter. Other: Mild scattered ascites. Mesenteric stranding. Anasarca. Diffuse motion artifact Musculoskeletal: Curvature of the spine with scattered degenerative changes. IMPRESSION: Dilated gallbladder with wall thickening and irregular gallbladder wall with stones. Please correlate for clinical evidence of acute cholecystitis. If needed a HIDA scan. No ductal dilatation. Changes once again of edema throughout the pancreas. Please correlate for any clinical evidence of pancreatitis. No well-defined fluid collections. Preserved pancreatic enhancement. Evidence of chronic liver disease with the micronodular liver. Scattered ascites. No splenomegaly. There are varices but the main portal  vein is patent. Main portal vein is small in caliber. Focal abnormality along the liver in segment 4 B corresponding to the finding by CT scan does not have aggressive features. No hypervascularity, washout or abnormal diffusion. This could represent an area of fatty sparing as there is some underlying diffuse fatty liver infiltration noted. Liver rads category 3. Increasing lung base parenchymal changes. Atelectasis versus infiltrate. Recommend follow-up [place] Electronically Signed   By: Jill Side M.D.   On: 06/19/2022 15:36   ECHOCARDIOGRAM COMPLETE  Result Date: 06/19/2022    ECHOCARDIOGRAM REPORT   Patient Name:   Kim Kim Date of Exam: 06/19/2022 Medical Rec #:  [id number]        Height:       65.0 in Accession #:    [id number]       Weight:       166.4 lb Date of Birth:  [birth year]         BSA:          1.830 m Patient Age:    44 years         BP:           104/79 mmHg Patient Gender: F                HR:           87 bpm. Exam Location:  Inpatient Procedure: 2D Echo, Color Doppler, Cardiac Doppler and Intracardiac            Opacification Agent Indications:    Elevated troponin  History:        Patient has no prior history of Echocardiogram examinations.                 Risk Factors:Hypertension and Dyslipidemia.  Sonographer:    Eartha Inch Referring Phys: [id number] BELKYS A REGALADO  Sonographer Comments: Technically difficult study due to poor echo windows. Image acquisition challenging due to patient body habitus and Image acquisition challenging due to respiratory motion. IMPRESSIONS  1. Left ventricular ejection fraction, by estimation, is 60 to 65%. Left ventricular ejection fraction by 2D MOD biplane is 66.6 %. The left ventricle has normal function. The left ventricle has no regional wall motion abnormalities. Left ventricular diastolic parameters were normal.  2. Right ventricular systolic function is mildly reduced. The right ventricular size is mildly enlarged. Tricuspid regurgitation  signal is inadequate for assessing PA pressure.  3. The mitral valve was not well visualized. No evidence of mitral valve regurgitation. No evidence of mitral stenosis.  4. The aortic valve is tricuspid. Aortic valve regurgitation  is not visualized. No aortic stenosis is present.  5. The inferior vena cava is normal in size with greater than 50% respiratory variability, suggesting right atrial pressure of 3 mmHg. FINDINGS  Left Ventricle: Left ventricular ejection fraction, by estimation, is 60 to 65%. Left ventricular ejection fraction by 2D MOD biplane is 66.6 %. The left ventricle has normal function. The left ventricle has no regional wall motion abnormalities. Definity contrast agent was given IV to delineate the left ventricular endocardial borders. The left ventricular internal cavity size was normal in size. There is no left ventricular hypertrophy. Left ventricular diastolic parameters were normal. Right Ventricle: The right ventricular size is mildly enlarged. No increase in right ventricular wall thickness. Right ventricular systolic function is mildly reduced. Tricuspid regurgitation signal is inadequate for assessing PA pressure. The tricuspid regurgitant velocity is 1.67 m/s, and with an assumed right atrial pressure of 3 mmHg, the estimated right ventricular systolic pressure is 14.7 mmHg. Left [facility]: Left atrial size was normal in size. Right [facility]: Right atrial size was normal in size. Pericardium: There is no evidence of pericardial effusion. Mitral Valve: The mitral valve was not well visualized. No evidence of mitral valve regurgitation. No evidence of mitral valve stenosis. MV peak gradient, 4.8 mmHg. The mean mitral valve gradient is 2.0 mmHg. Tricuspid Valve: The tricuspid valve is normal in structure. Tricuspid valve regurgitation is trivial. No evidence of tricuspid stenosis. Aortic Valve: The aortic valve is tricuspid. Aortic valve regurgitation is not visualized. No aortic stenosis is  present. Aortic valve mean gradient measures 8.0 mmHg. Aortic valve peak gradient measures 12.4 mmHg. Aortic valve area, by VTI measures 2.24  cm. Pulmonic Valve: The pulmonic valve was not well visualized. Pulmonic valve regurgitation is not visualized. No evidence of pulmonic stenosis. Aorta: The aortic root and ascending aorta are structurally normal, with no evidence of dilitation. Venous: The inferior vena cava was not well visualized. The inferior vena cava is normal in size with greater than 50% respiratory variability, suggesting right atrial pressure of 3 mmHg. IAS/Shunts: No atrial level shunt detected by color flow Doppler.  LEFT VENTRICLE PLAX 2D                        Biplane EF (MOD) LVIDd:         4.60 cm         LV Biplane EF:   Left LVIDs:         2.90 cm                          ventricular LV PW:         0.90 cm                          ejection LV IVS:        0.90 cm                          fraction by LVOT diam:     2.00 cm                          2D MOD LV SV:         73  biplane is LV SV Index:   40                               66.6 %. LVOT Area:     3.14 cm                                Diastology                                LV e' medial:    6.85 cm/s LV Volumes (MOD)               LV E/e' medial:  9.3 LV vol d, MOD    41.4 ml       LV e' lateral:   9.90 cm/s A2C:                           LV E/e' lateral: 6.4 LV vol d, MOD    69.6 ml A4C: LV vol s, MOD    12.2 ml A2C: LV vol s, MOD    26.5 ml A4C: LV SV MOD A2C:   29.2 ml LV SV MOD A4C:   69.6 ml LV SV MOD BP:    35.8 ml RIGHT VENTRICLE RV S prime:     15.50 cm/s TAPSE (M-mode): 2.7 cm LEFT ATRIUM             Index        RIGHT ATRIUM           Index LA diam:        3.40 cm 1.86 cm/m   RA Area:     10.70 cm LA Vol (A2C):   35.2 ml 19.24 ml/m  RA Volume:   20.90 ml  11.42 ml/m LA Vol (A4C):   33.7 ml 18.42 ml/m LA Biplane Vol: 34.9 ml 19.08 ml/m  AORTIC VALVE AV Area (Vmax):    2.41 cm AV Area  (Vmean):   2.12 cm AV Area (VTI):     2.24 cm AV Vmax:           176.00 cm/s AV Vmean:          135.000 cm/s AV VTI:            0.327 m AV Peak Grad:      12.4 mmHg AV Mean Grad:      8.0 mmHg LVOT Vmax:         135.00 cm/s LVOT Vmean:        91.200 cm/s LVOT VTI:          0.233 m LVOT/AV VTI ratio: 0.71  AORTA Ao Root diam: 3.00 cm Ao Asc diam:  3.10 cm MITRAL VALVE               TRICUSPID VALVE MV Area (PHT): 2.82 cm    TR Peak grad:   11.2 mmHg MV Area VTI:   2.49 cm    TR Vmax:        167.00 cm/s MV Peak grad:  4.8 mmHg MV Mean grad:  2.0 mmHg    SHUNTS MV Vmax:       1.10 m/s    Systemic VTI:  0.23 m MV Vmean:      71.7 cm/s   Systemic  Diam: 2.00 cm MV Decel Time: 269 msec MV E velocity: 63.40 cm/s MV A velocity: 91.30 cm/s MV E/A ratio:  0.69 Jenkins Rouge MD Electronically signed by Jenkins Rouge MD Signature Date/Time: 06/19/2022/9:58:47 AM    Final    IR Paracentesis  Result Date: 06/19/2022 INDICATION: Patient with history of alcohol abuse presents with abdominal distension, previous imaging showed ascites. Request for therapeutic and diagnostic paracentesis. EXAM: ULTRASOUND GUIDED PARACENTESIS MEDICATIONS: 10 mL 1% lidocaine COMPLICATIONS: None immediate. PROCEDURE: Informed written consent was obtained from the patient after a discussion of the risks, benefits and alternatives to treatment. A timeout was performed prior to the initiation of the procedure. Initial ultrasound scanning demonstrates a small amount of ascites within the right lower abdominal quadrant. The right lower abdomen was prepped and draped in the usual sterile fashion. 1% lidocaine was used for local anesthesia. Following this, a 19 gauge, 7-cm, Yueh catheter was introduced. An ultrasound image was saved for documentation purposes. The paracentesis was performed. The catheter was removed and a dressing was applied. The patient tolerated the procedure well without immediate post procedural complication. FINDINGS: A total of  approximately 1 L of hazy yellow fluid was removed. Samples were sent to the laboratory as requested by the clinical team. IMPRESSION: Successful ultrasound-guided paracentesis yielding 1 liter of peritoneal fluid. PLAN: If the patient eventually requires >/=2 paracenteses in a 30 day period, candidacy for formal evaluation by the [facility] will be assessed. Read by: Durenda Guthrie, PA-C Electronically Signed   By: Sandi Mariscal M.D.   On: 06/19/2022 08:21   DG Chest 2 View  Result Date: 06/18/2022 CLINICAL DATA:  Leukocytosis EXAM: CHEST - 2 VIEW COMPARISON:  None Available. FINDINGS: Normal heart size, mediastinal contours, and pulmonary vascularity. Atelectasis versus infiltrate at RIGHT base. Remaining lungs clear. No acute infiltrate, pleural effusion, or pneumothorax. Bones demineralized. IMPRESSION: Atelectasis versus infiltrate at RIGHT base. Electronically Signed   By: Lavonia Dana M.D.   On: 06/18/2022 17:10   CT Abdomen Pelvis W Contrast  Result Date: 06/17/2022 CLINICAL DATA:  Pancreatitis.  Jaundice.  Altered mental status. EXAM: CT ABDOMEN AND PELVIS WITH CONTRAST TECHNIQUE: Multidetector CT imaging of the abdomen and pelvis was performed using the standard protocol following bolus administration of intravenous contrast. RADIATION DOSE REDUCTION: This exam was performed according to the departmental dose-optimization program which includes automated exposure control, adjustment of the mA and/or kV according to patient size and/or use of iterative reconstruction technique. CONTRAST:  52m OMNIPAQUE IOHEXOL 350 MG/ML SOLN COMPARISON:  None Available. FINDINGS: Lower chest: There is a small amount of patchy atelectasis and airspace disease in the right lung base. Hepatobiliary: Gallstones are present. There is no biliary ductal dilatation. Liver is diffusely heterogeneous. Can not exclude subtle focal rounded lesion in the left lobe of the liver  measuring 3.1 cm image 3/28. This may represent focal fat in the gallbladder fossa. Pancreas: Unremarkable. No pancreatic ductal dilatation or surrounding inflammatory changes. Spleen: Normal in size without focal abnormality. Adrenals/Urinary Tract: Adrenal glands are unremarkable. Kidneys are normal, without renal calculi, focal lesion, or hydronephrosis. Bladder is unremarkable. Stomach/Bowel: There is a small hiatal hernia. The stomach is otherwise within normal limits. There is no evidence for bowel obstruction, pneumatosis or free air. The appendix is not visualized. Vascular/Lymphatic: Aortic atherosclerosis. No enlarged abdominal or pelvic lymph nodes. 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 Reproductive: Uterine fibroid is present measuring 19 mm. Ovaries are unremarkable. Other: There is moderate ascites throughout the abdomen  and pelvis. There is no focal abdominal wall hernia. Musculoskeletal: No acute or significant osseous findings. IMPRESSION: 1. Moderate ascites throughout the abdomen and pelvis. 2. Heterogeneous liver with possible focal lesion in the left lobe of the liver. Correlate clinically with LFTs. Recommend further evaluation with MRI. 3. Retroperitoneal varices. 4. Cholelithiasis. 5. Uterine fibroid. Aortic Atherosclerosis (ICD10-I70.0). Electronically Signed   By: Ronney Asters M.D.   On: 06/17/2022 22:10   CT Head Wo Contrast  Result Date: 06/17/2022 CLINICAL DATA:  Altered mental status. EXAM: CT HEAD WITHOUT CONTRAST TECHNIQUE: Contiguous axial images were obtained from the base of the skull through the vertex without intravenous contrast. RADIATION DOSE REDUCTION: This exam was performed according to the departmental dose-optimization program which includes automated exposure control, adjustment of the mA and/or kV according to patient size and/or use of iterative reconstruction technique. COMPARISON:  None Available. FINDINGS: Brain: There is mild cerebral atrophy with widening of  the extra-axial spaces and ventricular dilatation. There are areas of decreased attenuation within the white matter tracts of the supratentorial brain, consistent with microvascular disease changes. Vascular: No hyperdense vessel or unexpected calcification. Skull: Normal. Negative for fracture or focal lesion. Sinuses/Orbits: No acute finding. Other: None. IMPRESSION: 1. No acute intracranial abnormality. 2. Cerebral atrophy and microvascular disease changes of the supratentorial brain. Electronically Signed   By: Virgina Norfolk M.D.   On: 06/17/2022 19:13     Subjective: Patient seen examined at bedside, resting calmly.  Lying in bed.  No specific complaints this morning.  Ready for discharge home.  Discussed needs complete abstinence from alcohol.  Also discussed GI recommendations of outpatient follow-up with [facility] liver clinic.  Discussed patient needs repeat CMP 1 week at PCP visit.  No other specific complaints or concerns at this time.  Denies headache, no dizziness, no chest pain, no palpitations, no shortness of breath, no abdominal pain, no focal weakness, no fever/chills/night sweats, no nausea/vomiting/diarrhea, no abdominal pain, no paresthesias.  No acute events overnight per nursing staff.  Discharge Exam: Vitals:   06/24/22 0404 06/24/22 0934  BP: 126/62 127/72  Pulse: 77 87  Resp: 18 18  Temp: 97.9 F (36.6 C) 97.7 F (36.5 C)  SpO2: 98% 93%   Vitals:   06/23/22 2004 06/23/22 2110 06/24/22 0404 06/24/22 0934  BP: 124/61  126/62 127/72  Pulse: 84  77 87  Resp: '17  18 18  '$ Temp: 98.2 F (36.8 C)  97.9 F (36.6 C) 97.7 F (36.5 C)  TempSrc: Oral  Oral Oral  SpO2: 93%  98% 93%  Weight:  83.3 kg    Height:        Physical Exam: GEN: NAD, alert and oriented x 3, chronically ill in appearance, appears older than stated age HEENT: NCAT, PERRL, EOMI, sclera icterus, MMM PULM: CTAB w/o wheezes/crackles, normal respiratory effort, on room air CV: RRR w/o  M/G/R GI: abd soft, NTND, NABS, no R/G/M MSK: no peripheral edema, moves all extremities independently NEURO: CN II-XII intact, no focal deficits, sensation to light touch intact PSYCH: normal mood/affect Integumentary: Jaundice, otherwise skin with no concerning rashes/lesions/wounds noted on exposed skin surfaces.     The results of significant diagnostics from this hospitalization (including imaging, microbiology, ancillary and laboratory) are listed below for reference.     Microbiology: Recent Results (from the past 240 hour(s))  Respiratory (~20 pathogens) panel by PCR     Status: None   Collection Time: 06/18/22 11:51 AM   Specimen: Nasopharyngeal Swab; Respiratory  Result Value Ref Range Status   Adenovirus NOT DETECTED NOT DETECTED Final   Coronavirus 229E NOT DETECTED NOT DETECTED Final    Comment: (NOTE) The Coronavirus on the Respiratory Panel, DOES NOT test for the novel  Coronavirus (2019 nCoV)    Coronavirus HKU1 NOT DETECTED NOT DETECTED Final   Coronavirus NL63 NOT DETECTED NOT DETECTED Final   Coronavirus OC43 NOT DETECTED NOT DETECTED Final   Metapneumovirus NOT DETECTED NOT DETECTED Final   Rhinovirus / Enterovirus NOT DETECTED NOT DETECTED Final   Influenza A NOT DETECTED NOT DETECTED Final   Influenza B NOT DETECTED NOT DETECTED Final   Parainfluenza Virus 1 NOT DETECTED NOT DETECTED Final   Parainfluenza Virus 2 NOT DETECTED NOT DETECTED Final   Parainfluenza Virus 3 NOT DETECTED NOT DETECTED Final   Parainfluenza Virus 4 NOT DETECTED NOT DETECTED Final   Respiratory Syncytial Virus NOT DETECTED NOT DETECTED Final   Bordetella pertussis NOT DETECTED NOT DETECTED Final   Bordetella Parapertussis NOT DETECTED NOT DETECTED Final   Chlamydophila pneumoniae NOT DETECTED NOT DETECTED Final   Mycoplasma pneumoniae NOT DETECTED NOT DETECTED Final    Comment: Performed at [facility], [place] [address], [place], [place] [address]  Culture, body fluid w Gram  Stain-bottle     Status: None   Collection Time: 06/18/22 12:51 PM   Specimen: Fluid  Result Value Ref Range Status   Specimen Description FLUID PERITONEAL ABDOMEN  Final   Special Requests BOTTLES DRAWN AEROBIC AND ANAEROBIC  Final   Culture   Final    NO GROWTH 5 DAYS Performed at [facility], [place] [address], [place], [place] [address]    Report Status 06/23/2022 FINAL  Final  Gram stain     Status: None   Collection Time: 06/18/22 12:51 PM   Specimen: Fluid  Result Value Ref Range Status   Specimen Description FLUID PERITONEAL ABDOMEN  Final   Special Requests NONE  Final   Gram Stain   Final    WBC PRESENT, PREDOMINANTLY MONONUCLEAR NO ORGANISMS SEEN CYTOSPIN SMEAR Performed at [facility], [place] [address] [place], [place] [address]    Report Status 06/18/2022 FINAL  Final  Culture, blood (Routine X 2) w Reflex to ID Panel     Status: None   Collection Time: 06/18/22  3:50 PM   Specimen: BLOOD  Result Value Ref Range Status   Specimen Description BLOOD SITE NOT SPECIFIED  Final   Special Requests   Final    BOTTLES DRAWN AEROBIC AND ANAEROBIC Blood Culture results may not be optimal due to an excessive volume of blood received in culture bottles   Culture   Final    NO GROWTH 5 DAYS Performed at [facility], [place] [address], [place], [place] [address]    Report Status 06/23/2022 FINAL  Final  Culture, blood (Routine X 2) w Reflex to ID Panel     Status: None   Collection Time: 06/18/22  3:50 PM   Specimen: BLOOD  Result Value Ref Range Status   Specimen Description BLOOD SITE NOT SPECIFIED  Final   Special Requests   Final    BOTTLES DRAWN AEROBIC AND ANAEROBIC Blood Culture results may not be optimal due to an inadequate volume of blood received in culture bottles   Culture   Final    NO GROWTH 5 DAYS Performed at [facility], [place] [address], [place], [place] [address]    Report  Status 06/23/2022 FINAL  Final  Urine Culture (for  pregnant, neutropenic or urologic patients or patients with an indwelling urinary catheter)     Status: None   Collection Time: 06/20/22  4:18 PM   Specimen: Urine, Clean Catch  Result Value Ref Range Status   Specimen Description URINE, CLEAN CATCH  Final   Special Requests NONE  Final   Culture   Final    NO GROWTH Performed at [facility], [place] [address], [place], [place] [address]    Report Status 06/21/2022 FINAL  Final     Labs: BNP (last 3 results) No results for input(s): "BNP" in the last 8760 hours. Basic Metabolic Panel: Recent Labs  Lab 06/17/22 2056 06/18/22 0424 06/19/22 0958 06/20/22 0249 06/21/22 0811 06/22/22 0300 06/23/22 0900 06/24/22 0434  NA  --    < > 129* 133* 135 134* 135 132*  K  --    < > 3.8 3.5 3.8 3.7 3.3* 3.9  CL  --    < > 90* 98 102 100 102 102  CO2  --    < > '28 28 24 26 23 22  '$ GLUCOSE  --    < > 142* 121* 79 98 97 105*  BUN  --    < > 23* 23* '19 17 20 17  '$ CREATININE  --    < > 1.12* 0.85 0.86 0.90 0.95 0.78  CALCIUM  --    < > 7.8* 7.7* 7.8* 7.8* 8.0* 7.9*  MG 2.6*  --  2.5*  --   --   --   --   --    < > = values in this interval not displayed.   Liver Function Tests: Recent Labs  Lab 06/20/22 0249 06/21/22 0811 06/22/22 0300 06/23/22 0900 06/24/22 0434  AST 125* 154* 162* 182* 148*  ALT 44 57* 57* 68* 69*  ALKPHOS 172* 170* 177* 205* 189*  BILITOT 8.4* 7.9* 6.9* 7.8* 6.7*  PROT 4.9* 5.1* 4.9* 5.3* 4.7*  ALBUMIN <1.5* 1.6* 1.5* 1.7* 1.5*   Recent Labs  Lab 06/17/22 2056  LIPASE 55*   Recent Labs  Lab 06/17/22 1749 06/22/22 0856  AMMONIA 57* 52*   CBC: Recent Labs  Lab 06/20/22 0249 06/21/22 0811 06/22/22 0300 06/23/22 0900 06/24/22 0434  WBC 17.3* 18.7* 18.0* 21.5* 18.4*  HGB 13.0 13.9 13.6 14.7 13.0  HCT 36.5 39.2 40.1 41.6 36.5  MCV 95.1 96.6 98.5 95.9 97.1  PLT 132* 152 166 184 154   Cardiac Enzymes: Recent Labs  Lab 06/18/22 0424  CKTOTAL 49   BNP: Invalid input(s):  "POCBNP" CBG: Recent Labs  Lab 06/17/22 1726  GLUCAP 120*   D-Dimer No results for input(s): "DDIMER" in the last 72 hours. Hgb A1c No results for input(s): "HGBA1C" in the last 72 hours. Lipid Profile No results for input(s): "CHOL", "HDL", "LDLCALC", "TRIG", "CHOLHDL", "LDLDIRECT" in the last 72 hours. Thyroid function studies No results for input(s): "TSH", "T4TOTAL", "T3FREE", "THYROIDAB" in the last 72 hours.  Invalid input(s): "FREET3" Anemia work up No results for input(s): "VITAMINB12", "FOLATE", "FERRITIN", "TIBC", "IRON", "RETICCTPCT" in the last 72 hours. Urinalysis    Component Value Date/Time   COLORURINE AMBER (A) 06/18/2022 2300   APPEARANCEUR HAZY (A) 06/18/2022 2300   LABSPEC 1.029 06/18/2022 2300   PHURINE 6.0 06/18/2022 2300   GLUCOSEU NEGATIVE 06/18/2022 2300   HGBUR SMALL (A) 06/18/2022 2300   BILIRUBINUR MODERATE (A) 06/18/2022 2300   KETONESUR NEGATIVE 06/18/2022 2300  PROTEINUR 30 (A) 06/18/2022 2300   UROBILINOGEN 0.2 12/16/2013 0643   NITRITE POSITIVE (A) 06/18/2022 2300   LEUKOCYTESUR MODERATE (A) 06/18/2022 2300   Sepsis Labs Recent Labs  Lab 06/21/22 0811 06/22/22 0300 06/23/22 0900 06/24/22 0434  WBC 18.7* 18.0* 21.5* 18.4*   Microbiology Recent Results (from the past 240 hour(s))  Respiratory (~20 pathogens) panel by PCR     Status: None   Collection Time: 06/18/22 11:51 AM   Specimen: Nasopharyngeal Swab; Respiratory  Result Value Ref Range Status   Adenovirus NOT DETECTED NOT DETECTED Final   Coronavirus 229E NOT DETECTED NOT DETECTED Final    Comment: (NOTE) The Coronavirus on the Respiratory Panel, DOES NOT test for the novel  Coronavirus (2019 nCoV)    Coronavirus HKU1 NOT DETECTED NOT DETECTED Final   Coronavirus NL63 NOT DETECTED NOT DETECTED Final   Coronavirus OC43 NOT DETECTED NOT DETECTED Final   Metapneumovirus NOT DETECTED NOT DETECTED Final   Rhinovirus / Enterovirus NOT DETECTED NOT DETECTED Final   Influenza  A NOT DETECTED NOT DETECTED Final   Influenza B NOT DETECTED NOT DETECTED Final   Parainfluenza Virus 1 NOT DETECTED NOT DETECTED Final   Parainfluenza Virus 2 NOT DETECTED NOT DETECTED Final   Parainfluenza Virus 3 NOT DETECTED NOT DETECTED Final   Parainfluenza Virus 4 NOT DETECTED NOT DETECTED Final   Respiratory Syncytial Virus NOT DETECTED NOT DETECTED Final   Bordetella pertussis NOT DETECTED NOT DETECTED Final   Bordetella Parapertussis NOT DETECTED NOT DETECTED Final   Chlamydophila pneumoniae NOT DETECTED NOT DETECTED Final   Mycoplasma pneumoniae NOT DETECTED NOT DETECTED Final    Comment: Performed at [facility], [place]. [address], [place], [place] [address]  Culture, body fluid w Gram Stain-bottle     Status: None   Collection Time: 06/18/22 12:51 PM   Specimen: Fluid  Result Value Ref Range Status   Specimen Description FLUID PERITONEAL ABDOMEN  Final   Special Requests BOTTLES DRAWN AEROBIC AND ANAEROBIC  Final   Culture   Final    NO GROWTH 5 DAYS Performed at [facility], [place] [address], [place], [place] [address]    Report Status 06/23/2022 FINAL  Final  Gram stain     Status: None   Collection Time: 06/18/22 12:51 PM   Specimen: Fluid  Result Value Ref Range Status   Specimen Description FLUID PERITONEAL ABDOMEN  Final   Special Requests NONE  Final   Gram Stain   Final    WBC PRESENT, PREDOMINANTLY MONONUCLEAR NO ORGANISMS SEEN CYTOSPIN SMEAR Performed at [facility], [place] [address] [place], [place] [address]    Report Status 06/18/2022 FINAL  Final  Culture, blood (Routine X 2) w Reflex to ID Panel     Status: None   Collection Time: 06/18/22  3:50 PM   Specimen: BLOOD  Result Value Ref Range Status   Specimen Description BLOOD SITE NOT SPECIFIED  Final   Special Requests   Final    BOTTLES DRAWN AEROBIC AND ANAEROBIC Blood Culture results may not be optimal due to an excessive volume of blood received in culture bottles   Culture    Final    NO GROWTH 5 DAYS Performed at [facility], [place] [address] [place], [place] [address]    Report Status 06/23/2022 FINAL  Final  Culture, blood (Routine X 2) w Reflex to ID Panel     Status: None   Collection Time: 06/18/22  3:50 PM   Specimen: BLOOD  Result Value Ref Range Status   Specimen Description BLOOD SITE NOT SPECIFIED  Final   Special Requests   Final    BOTTLES DRAWN AEROBIC AND ANAEROBIC Blood Culture results may not be optimal due to an inadequate volume of blood received in culture bottles   Culture   Final    NO GROWTH 5 DAYS Performed at [facility], [place] [address], [place], [place] [address]    Report Status 06/23/2022 FINAL  Final  Urine Culture (for pregnant, neutropenic or urologic patients or patients with an indwelling urinary catheter)     Status: None   Collection Time: 06/20/22  4:18 PM   Specimen: Urine, Clean Catch  Result Value Ref Range Status   Specimen Description URINE, CLEAN CATCH  Final   Special Requests NONE  Final   Culture   Final    NO GROWTH Performed at [place] [facility], [place] [address] [place], [place] [address]    Report Status 06/21/2022 FINAL  Final     Time coordinating discharge: Over 30 minutes  SIGNED:   Yehudis Monceaux J [place] DO  [facility] 06/24/2022, 10:17 AM

## 2022-06-24 NOTE — Progress Notes (Signed)
DISCHARGE NOTE HOME [place] to be discharged Home per MD order. Discussed prescriptions and follow up appointments with the patient. Prescriptions given to patient; medication list explained in detail. Patient verbalized understanding.  Skin clean, dry and intact without evidence of skin break down, no evidence of skin tears noted. IV catheter discontinued intact. Site without signs and symptoms of complications. Dressing and pressure applied. Pt denies pain at the site currently. No complaints noted.  Patient free of lines, drains, and wounds.   An After Visit Summary (AVS) was printed and given to the patient. Patient escorted via wheelchair, and discharged home via private auto.  Keenan Bachelor, RN

## 2022-06-24 NOTE — Telephone Encounter (Signed)
Appt made with [facility] on 06/30/22 at 10 am   The pt is aware

## 2022-06-24 NOTE — TOC Transition Note (Signed)
Transition of Care [facility]) - CM/SW Discharge Note   Patient Details  Name: Kim Holland MRN: [id number] Date of Birth: [birth year]  [facility]) CM/SW Contact:  Tom-Johnson, Renea Ee, RN Phone Number: 06/24/2022, 10:52 AM   Clinical Narrative:     Patient is scheduled for discharge today. Home health info on AVS. RW and BSC delivered to patient at bedside by [employer]. Prescription meds sent to [facility] and meds to be delivered to patient at bedside prior discharge. Husband to transport at discharge. No further TOC needs noted.         Final next level of care: Home w Home Health Services Barriers to Discharge: Barriers Resolved   Patient Goals and CMS Choice CMS Medicare.gov Compare Post Acute Care list provided to:: Patient Choice offered to / list presented to : Patient, Spouse  Discharge Placement                  Patient to be transferred to facility by: Husband      Discharge Plan and Services Additional resources added to the After Visit Summary for     Discharge Planning Services: CM Consult Post Acute Care Choice: Home Health          DME Arranged: Bedside commode, Walker rolling DME Agency: [employer] Date DME Agency Contacted: 06/22/22 Time DME Agency Contacted: 1620 Representative spoke with at DME Agency: Maudie Mercury HH Arranged: PT, OT, RN, Disease Management [place] Agency: [place] Date [place]: 06/22/22 Time [place]: 1625 Representative spoke with at [place]: Amy  Social Determinants of Health ([place]) Interventions [place]: No Food Insecurity (06/24/2022)  Housing: Low Risk  (06/24/2022)  Transportation Needs: No Transportation Needs (06/24/2022)  Utilities: Not At Risk (06/24/2022)  Tobacco Use: Medium Risk (06/19/2022)     Readmission Risk Interventions     No data to display

## 2022-06-30 ENCOUNTER — Other Ambulatory Visit (HOSPITAL_COMMUNITY): Payer: Self-pay | Admitting: Nurse Practitioner

## 2022-06-30 DIAGNOSIS — K7030 Alcoholic cirrhosis of liver without ascites: Secondary | ICD-10-CM

## 2022-07-02 ENCOUNTER — Ambulatory Visit (INDEPENDENT_AMBULATORY_CARE_PROVIDER_SITE_OTHER): Payer: No Typology Code available for payment source | Admitting: Physician Assistant

## 2022-07-02 ENCOUNTER — Encounter: Payer: Self-pay | Admitting: Physician Assistant

## 2022-07-02 VITALS — BP 128/68 | HR 100 | Ht 65.0 in | Wt 179.0 lb

## 2022-07-02 DIAGNOSIS — K7460 Unspecified cirrhosis of liver: Secondary | ICD-10-CM | POA: Diagnosis not present

## 2022-07-02 DIAGNOSIS — Z86010 Personal history of colonic polyps: Secondary | ICD-10-CM

## 2022-07-02 DIAGNOSIS — K7290 Hepatic failure, unspecified without coma: Secondary | ICD-10-CM

## 2022-07-02 MED ORDER — NA SULFATE-K SULFATE-MG SULF 17.5-3.13-1.6 GM/177ML PO SOLN: 1.0000 | Freq: Once | ORAL | 0 refills | Status: AC

## 2022-07-02 MED ORDER — ALPRAZOLAM 1 MG PO TABS: 1.0000 mg | ORAL_TABLET | Freq: Once | ORAL | 0 refills | Status: AC

## 2022-07-02 NOTE — Progress Notes (Signed)
Chief Complaint: Discuss colonoscopy and endoscopy  HPI:    Kim Holland is a 62 year old female, known to Dr. Fuller Plan, with a past medical history of GERD, alcoholic decompensated cirrhosis (06/19/2022 echo with LVEF 60-65%) and anxiety as well as seizures (1 approximately 3 years ago), who was referred to me by Roosevelt Locks, CRNP for discussion of a colonoscopy and endoscopy.    06/22/2017 colonoscopy with Dr. Fuller Plan with two 5-6 mm polyps in the sigmoid and descending colon, single nonspecific erosion in the cecum, mild diverticulosis in the left colon otherwise normal.  Pathology showed mixture of tubular adenoma and hyperplastic polyp.  Repeat recommended in 5 years.    06/18/2022 patient consulted in the hospital by our service for decompensated cirrhosis.  She had quit drinking 3 weeks prior and had severe jaundice.  At admit her total bili was 12.7, AST 145, INR 1.7, ammonia 57.  CT showed moderate ascites throughout the abdomen pelvis and heterogenous liver with possible focal lesion in the left lobe of the liver, retroperitoneal varices and cholelithiasis.  At that time discussed that patient will need an EGD at some point, possibly as an outpatient and they are checking MRI.  Discriminant function was greater than 32.  Paracentesis with fluid negative for SBP.  Patient started on IV Prednisolone.    06/21/2022 the last time our service saw her in the hospital.  MELD plus sodium was 21.  Discriminant function score still greater than 32.  She was on Oral prednisone 40 mg daily and was told to continue for 4-week course.  LFTs are trending down.  She was told to follow with [facility].    Today, patient tells me that she went to the liver clinic on Tuesday.  They have since stopped her Prednisolone, she actually received a phone call yesterday to taper down and stop.  They told her she needs to follow-up with [place] in regards to an EGD and colonoscopy.  In regards to her liver  disease she feels much better already since recent hospitalization.  She has abstained from alcohol.  No other new GI complaints or concerns today.    Denies fever, chills, weight loss, nausea or vomiting.  Past Medical History:  Diagnosis Date   Allergy    Anxiety    GERD (gastroesophageal reflux disease)    Hyperlipidemia    Hypertension    Seizures ([place])    only 1 approximately 3 years ago   Skin tags, anus or rectum     Past Surgical History:  Procedure Laterality Date   DENTAL IMPLANTS     IR PARACENTESIS  06/18/2022    Current Outpatient Medications  Medication Sig Dispense Refill   cyanocobalamin (VITAMIN B12) 1000 MCG tablet Take 1,000 mcg by mouth daily.     EPINEPHrine (EPIPEN 2-PAK) 0.3 mg/0.3 mL IJ SOAJ injection EpiPen 2-Pak 0.3 mg/0.3 mL injection, auto-injector  use as directed by prescriber     escitalopram (LEXAPRO) 5 MG tablet Take 5 mg by mouth daily.     hydrOXYzine (ATARAX/VISTARIL) 25 MG tablet Take 25 mg by mouth 3 (three) times daily.     lactulose (CHRONULAC) 10 GM/15ML solution Take 15 mLs (10 g total) by mouth daily. Ensure 2-3 soft bowel movements daily 450 mL 2   Melatonin 10 MG TBCR Take 10 capsules by mouth at bedtime.     pantoprazole (PROTONIX) 40 MG tablet Take 1 tablet (40 mg total) by mouth daily. 30 tablet 2  predniSONE (DELTASONE) 10 MG tablet Take 4 tablets (40 mg total) by mouth daily for 21 days. 84 tablet 0   rifaximin (XIFAXAN) 550 MG TABS tablet Take 1 tablet (550 mg total) by mouth 2 (two) times daily. 60 tablet 2   No current facility-administered medications for this visit.    Allergies as of 07/02/2022 - Review Complete 06/18/2022  Allergen Reaction Noted   Latex  10/03/2013   Doxycycline Other (See Comments) 06/17/2022   Penicillin g sodium Other (See Comments) 06/17/2022   Ampicillin Rash 06/17/2022    Family History  Problem Relation Age of Onset   Breast cancer Mother    Hypertension Mother    Colon cancer Paternal  Aunt    Colon cancer Paternal Grandmother    Hypertension Father    Hypertension Brother    Colon polyps Brother    Esophageal cancer Neg Hx    Rectal cancer Neg Hx    Stomach cancer Neg Hx     Social History   Socioeconomic History   Marital status: Married    Spouse name: Not on file   Number of children: Not on file   Years of education: Not on file   Highest education level: Not on file  Occupational History   Not on file  Tobacco Use   Smoking status: Former    Types: Cigarettes    Quit date: 06/11/2016    Years since quitting: 6.0   Smokeless tobacco: Never   Tobacco comments:    occasionally smokes on rare occasion  Substance and Sexual Activity   Alcohol use: Yes    Alcohol/week: 12.0 - 14.0 standard drinks of alcohol    Types: 12 - 14 Glasses of wine per week    Comment: 2 glasses daily   Drug use: No   Sexual activity: Not on file  Other Topics Concern   Not on file  Social History Narrative   Not on file   Social Determinants of Health   Financial Resource Strain: Not on file  Food Insecurity: No Food Insecurity (06/24/2022)   Hunger Vital Sign    Worried About Running Out of Food in the Last Year: Never true    Ran Out of Food in the Last Year: Never true  Transportation Needs: No Transportation Needs (06/24/2022)   PRAPARE - Hydrologist (Medical): No    Lack of Transportation (Non-Medical): No  Physical Activity: Not on file  Stress: Not on file  Social Connections: Not on file  Intimate Partner Violence: Not At Risk (06/24/2022)   Humiliation, Afraid, Rape, and Kick questionnaire    Fear of Current or Ex-Partner: No    Emotionally Abused: No    Physically Abused: No    Sexually Abused: No    Review of Systems:    Constitutional: No weight loss, fever or chills Cardiovascular: No chest pain Respiratory: No SOB  Gastrointestinal: See HPI and otherwise negative   Physical Exam:  Vital signs: BP 128/68   Pulse  100   Ht '5\' 5"'$  (1.651 m)   Wt 179 lb (81.2 kg)   SpO2 95%   BMI 29.79 kg/m    Constitutional:   Pleasant jaundiced Caucasian female appears to be in NAD, Well developed, Well nourished, alert and cooperative Respiratory: Respirations even and unlabored. Lungs clear to auscultation bilaterally.   No wheezes, crackles, or rhonchi.  Cardiovascular: Normal S1, S2. No MRG. Regular rate and rhythm.+pitting edema worse on the right to level  of the knee Gastrointestinal:  Soft, mild distension, nontender. No rebound or guarding. Normal bowel sounds. No appreciable masses or hepatomegaly. Rectal:  Not performed.  Msk:  Symmetrical without gross deformities. Without edema, no deformity or joint abnormality. +ambulating with walker Psychiatric: Oriented to person, place and time. Demonstrates good judgement and reason without abnormal affect or behaviors.  RELEVANT LABS AND IMAGING: CBC    Component Value Date/Time   WBC 18.4 (H) 06/24/2022 0434   RBC 3.76 (L) 06/24/2022 0434   HGB 13.0 06/24/2022 0434   HCT 36.5 06/24/2022 0434   PLT 154 06/24/2022 0434   MCV 97.1 06/24/2022 0434   MCH 34.6 (H) 06/24/2022 0434   MCHC 35.6 06/24/2022 0434   RDW 14.5 06/24/2022 0434   LYMPHSABS 1.5 12/16/2013 0652   MONOABS 0.9 12/16/2013 0652   EOSABS 0.0 12/16/2013 0652   BASOSABS 0.0 12/16/2013 0652    CMP     Component Value Date/Time   NA 132 (L) 06/24/2022 0434   K 3.9 06/24/2022 0434   CL 102 06/24/2022 0434   CO2 22 06/24/2022 0434   GLUCOSE 105 (H) 06/24/2022 0434   BUN 17 06/24/2022 0434   CREATININE 0.78 06/24/2022 0434   CALCIUM 7.9 (L) 06/24/2022 0434   PROT 4.7 (L) 06/24/2022 0434   ALBUMIN 1.5 (L) 06/24/2022 0434   AST 148 (H) 06/24/2022 0434   ALT 69 (H) 06/24/2022 0434   ALKPHOS 189 (H) 06/24/2022 0434   BILITOT 6.7 (H) 06/24/2022 0434   GFRNONAA >60 06/24/2022 0434   GFRAA >90 12/16/2013 3875    Assessment: 1.  History of adenomatous polyps: Last colonoscopy in 2019 with  repeat recommended in 5 years, patient is due now 2.  Decompensated alcoholic cirrhosis: Recent hospitalization, since then patient has [facility], they are requesting an EGD for variceal screening  Plan: 1.  Scheduled patient for a diagnostic EGD for variceal screening and a surveillance colonoscopy given history of adenomatous polyps with Dr. Fuller Plan in the [facility].  We will wait 1 to 2 months to schedule these procedures to give her some time to recuperate after recent hospitalization.  Did provide the patient a detailed list of risks for the procedures and she agrees to proceed. Patient is appropriate for endoscopic procedure(s) in the ambulatory ([facility]) setting.  2.  Patient will continue to follow with the liver clinic in regards to her cirrhosis 3.  Patient requested a Xanax the morning of her procedure, apparently had tried a colonoscopy initially and her blood pressure was too high, Dr. Fuller Plan had prescribed a Xanax in the past.  Prescribed Xanax 1 g 1 tablet the morning of her procedure to help with anxiety.  #1 no refills. 4.  Patient to follow in clinic per recommendations from Dr. Fuller Plan after time of procedures.  Kim Newer, PA-C [facility] 07/02/2022, 9:29 AM  Cc: Roosevelt Locks, [place]

## 2022-07-02 NOTE — Patient Instructions (Signed)
If you are age 61 or older, your body mass index should be between 23-30. Your Body mass index is 29.79 kg/m. If this is out of the aforementioned range listed, please consider follow up with your Primary Care Provider.  If you are age 40 or younger, your body mass index should be between 19-25. Your Body mass index is 29.79 kg/m. If this is out of the aformentioned range listed, please consider follow up with your Primary Care Provider.   You have been scheduled for an endoscopy and colonoscopy. Please follow the written instructions given to you at your visit today. Please pick up your prep supplies at the pharmacy within the next 1-3 days. If you use inhalers (even only as needed), please bring them with you on the day of your procedure.   We have sent the following medications to your pharmacy for you to pick up at your convenience: Xanax 1 g the morning of your procedure.   The [facility] providers would like to encourage you to use [facility] to communicate with providers for non-urgent requests or questions.  Due to long hold times on the telephone, sending your provider a message by [facility] may be a faster and more efficient way to get a response.  Please allow 48 business hours for a response.  Please remember that this is for non-urgent requests.   It was a pleasure to see you today!  Thank you for trusting me with your gastrointestinal care!    Ellouise Newer, PA-C

## 2022-07-06 ENCOUNTER — Telehealth: Payer: Self-pay | Admitting: Physician Assistant

## 2022-07-06 NOTE — Telephone Encounter (Signed)
Inbound call from patient stating that she is having a paracentesis tomorrow and is requesting a call back to discuss if there is any type of prep for that. Please advise.

## 2022-07-06 NOTE — Telephone Encounter (Signed)
Returned call to patient. I informed patient that there is no prep or diet that she needs to follow prior to her paracentesis appt. Pt has been advised that she needs to arrive at [facility] by 12:30 pm for her 1 pm appt. Pt verbalized understanding and had no concerns at the end of the call.

## 2022-07-07 ENCOUNTER — Ambulatory Visit (HOSPITAL_COMMUNITY)
Admission: RE | Admit: 2022-07-07 | Discharge: 2022-07-07 | Disposition: A | Discharge status: Home / Self Care | Payer: No Typology Code available for payment source | Source: Ambulatory Visit | Attending: Nurse Practitioner | Admitting: Nurse Practitioner

## 2022-07-07 DIAGNOSIS — K7030 Alcoholic cirrhosis of liver without ascites: Secondary | ICD-10-CM

## 2022-07-07 DIAGNOSIS — K7031 Alcoholic cirrhosis of liver with ascites: Secondary | ICD-10-CM | POA: Insufficient documentation

## 2022-07-07 HISTORY — PX: IR PARACENTESIS: IMG2679

## 2022-07-07 MED ORDER — LIDOCAINE HCL 1 % IJ SOLN
INTRAMUSCULAR | Status: AC
  Administered 2022-07-07: 10 mL
  Filled 2022-07-07: qty 20

## 2022-07-07 NOTE — Procedures (Signed)
PROCEDURE SUMMARY:  Successful ultrasound guided paracentesis from the right lower quadrant.  Yielded 5L of ascitic fluid.  No immediate complications.  The patient tolerated the procedure well.   Specimen not sent for labs.  EBL < 21m  The patient has required >/=2 paracenteses in a 30 day period and [facility] has been arranged.   Electronically Signed: HPasty Spillers PA 09/04/2021, 2:42 PM

## 2022-07-13 NOTE — Progress Notes (Signed)
   Portal Hypertension Clinic Screening Evaluation   Indication for evaluation: Kim Holland is a 62 y.o. female undergoing preliminary evaluation in the [facility] Interventional [facility]pert[facility] due to recurrent ascites.  Referring Physician/Established Gastroenterologist:  Dr. Fuller Plan  Etiology of cirrhosis: EtOH Initially diagnosed: Jan 2024 # of paracentesis in last month: 2 # of paracentesis in last 2 months: 2 History of hepatic hydrothorax:  No History of hepatic encephalopathy: Possible, mild  Prior evaluation for liver transplant: No History of hepatocellular carcinoma: No  Prior esophagogastroduodenoscopy/intervention: No Current esophageal varices: no Current gastric varices: no History of hematemesis: no  Current diuretic regimen: furosemide 40 mg QD, spironolactone 100 mg QD Current pharmacologic encephalopathy prophylaxis/treatment: lactulose 30 mg TID  History of renal dysfunction: no History of hemodialysis: no  History of cardiac dysfunction: No  Other pertinent past medical history: anxiety, GERD, hyperlipidemia, hypertension, seizure, UTI   Imaging: Prior cross sectional imaging of portal system: MRI abdomen 06/17/22  Patent portal system, no significant varices.  Echocardiogram:  06/19/22 IMPRESSIONS   1. Left ventricular ejection fraction, by estimation, is 60 to 65%. Left  ventricular ejection fraction by 2D MOD biplane is 66.6 %. The left  ventricle has normal function. The left ventricle has no regional wall  motion abnormalities. Left ventricular  diastolic parameters were normal.   2. Right ventricular systolic function is mildly reduced. The right  ventricular size is mildly enlarged. Tricuspid regurgitation signal is  inadequate for assessing PA pressure.   3. The mitral valve was not well visualized. No evidence of mitral valve  regurgitation. No evidence of mitral stenosis.   4. The aortic valve is tricuspid.  Aortic valve regurgitation is not  visualized. No aortic stenosis is present.   5. The inferior vena cava is normal in size with greater than 50%  respiratory variability, suggesting right atrial pressure of 3 mmHg.    Labs: Creatinine: 0.78 Total Bilirubin: 6.7 INR: 1.7 Sodium: 132 Albumin: 1.5  Child-Pugh = 12 points, class C MELD = 20 (19.6% estimated 3 month mortality) [place] Index of Post-TIPS Survival (FIPS) = 0.70 (overall survival predicted at 1 month 91.2%, 3 months 72.9%, and 6 months 62.0%)    Assessment: Kim Holland is a 62 y.o. 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  After preliminary evaluation, this patient is a poor candidate for TIPS creation at this time with recent initiation of diuretics, but more specifically due to persistently resolving acute liver injury with hyperbilirubinemia.  Recommendation: No further follow up by Interventional Radiology due to poor candidacy for portal hypertension intervention.  If the patient becomes truly refractory to diuretics and has significant improvement in liver function, TIPS candidacy could be re-evaluated.     Electronically Signed: Suzette Battiest, MD 07/13/2022, 8:44 AM

## 2022-07-14 ENCOUNTER — Other Ambulatory Visit (HOSPITAL_COMMUNITY): Payer: Self-pay

## 2022-07-24 ENCOUNTER — Other Ambulatory Visit (HOSPITAL_COMMUNITY): Payer: Self-pay

## 2022-07-27 ENCOUNTER — Other Ambulatory Visit (HOSPITAL_COMMUNITY): Payer: Self-pay

## 2022-07-27 ENCOUNTER — Other Ambulatory Visit (HOSPITAL_BASED_OUTPATIENT_CLINIC_OR_DEPARTMENT_OTHER): Payer: Self-pay

## 2022-08-28 ENCOUNTER — Telehealth: Payer: Self-pay | Admitting: Physician Assistant

## 2022-08-28 NOTE — Telephone Encounter (Signed)
Returned call to patient. Left patient a detailed vm letting her know that she should hold lactulose the day she begins her bowel prep and the day of the procedure since she will have several bowel movements from the bowel prep. [facility]anax prescription was [facility] to [facility] [address] back on 07/02/22. Pt has been advised to contact her pharmacy regarding prescription, if she is not able to pick up RX pt should call us back.

## 2022-08-28 NOTE — Telephone Encounter (Signed)
PT is calling to find out if she should stop taking lactulose while prepping for colonoscopy. She was also told that she would be prescribed a Xanax before her procedure. [facility] [address]

## 2022-08-31 MED ORDER — ALPRAZOLAM 1 MG PO TABS: ORAL_TABLET | ORAL | 0 refills | Status: DC

## 2022-08-31 NOTE — Telephone Encounter (Signed)
Patient called regarding Xanax prescription. States she got in contact with [facility] and states that the pharmacy has no record of prescription from February 2024. Was wondering if it could be sent in again. Please advise.

## 2022-08-31 NOTE — Addendum Note (Signed)
Addended by: Missy Sabins on: 08/31/2022 09:48 AM   Modules accepted: Orders

## 2022-08-31 NOTE — Telephone Encounter (Signed)
Called [facility] pharmacy and spoke with Vonna Kotyk (pharmacist). I gave verbal order for Xanax 1 gm, #1 and 0 refills.

## 2022-09-01 IMAGING — MG DIGITAL SCREENING BILAT W/ TOMO W/ CAD
8 series · 8 of 24 positions shown · non-contrast
Comparison: Previous exam(s).

CLINICAL DATA: Screening.

EXAM:
DIGITAL SCREENING BILATERAL MAMMOGRAM WITH TOMO AND CAD

[R MLO synth-2D]
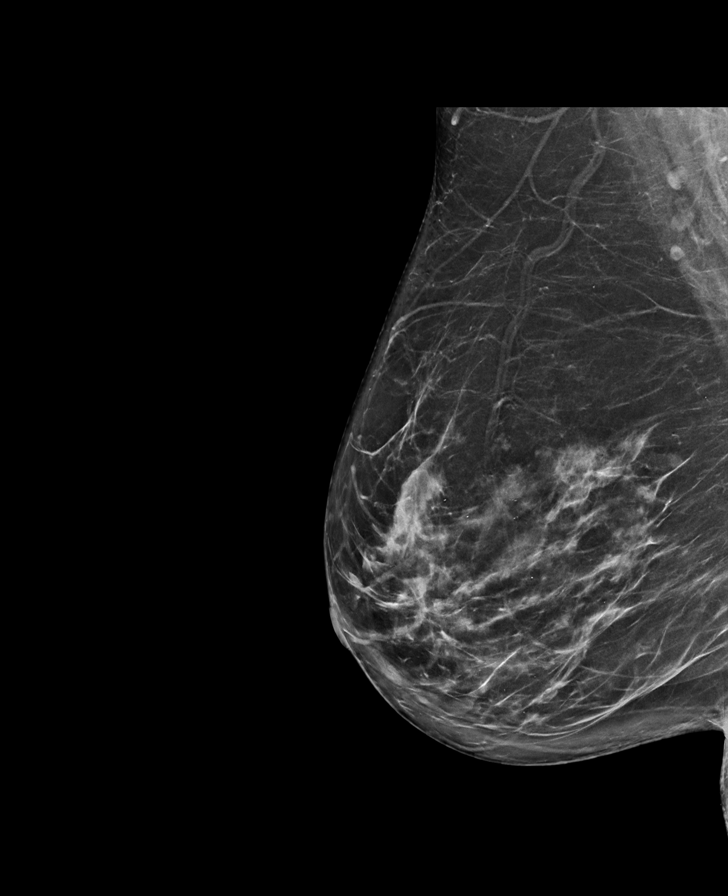

[R CC synth-2D]
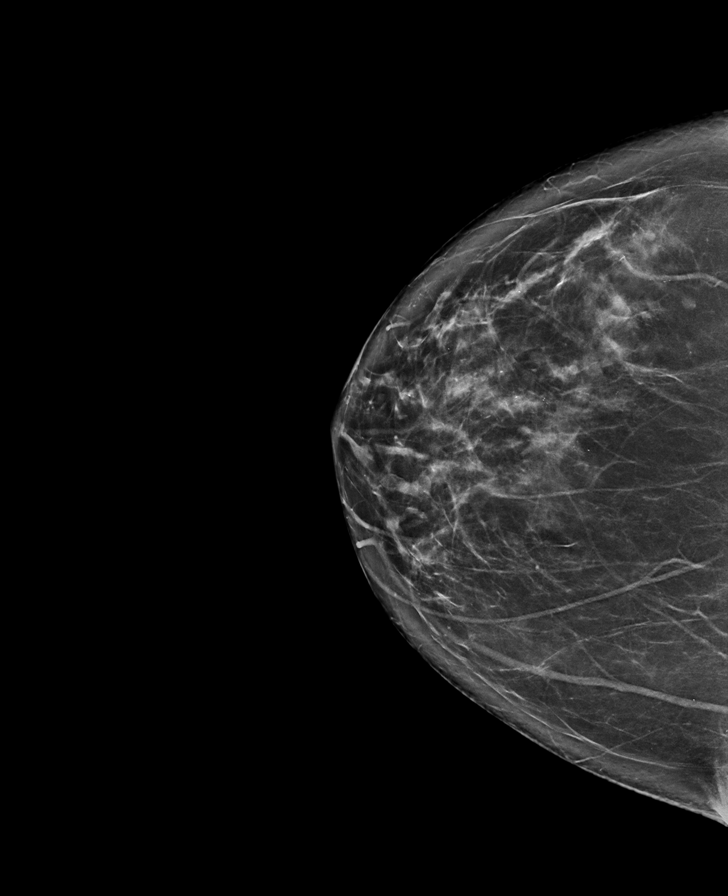

[L MLO synth-2D]
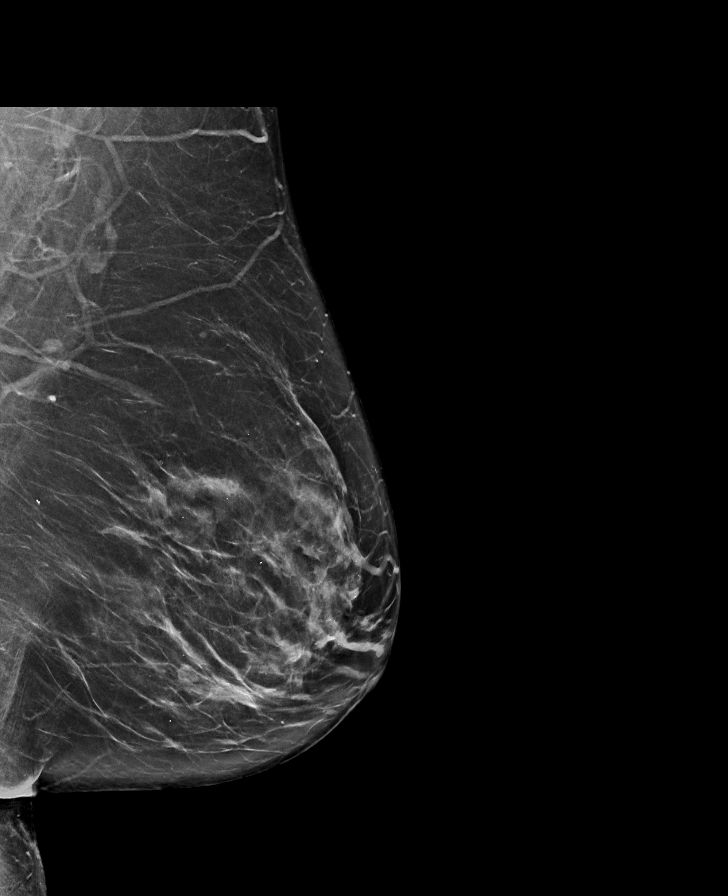

[L CC synth-2D]
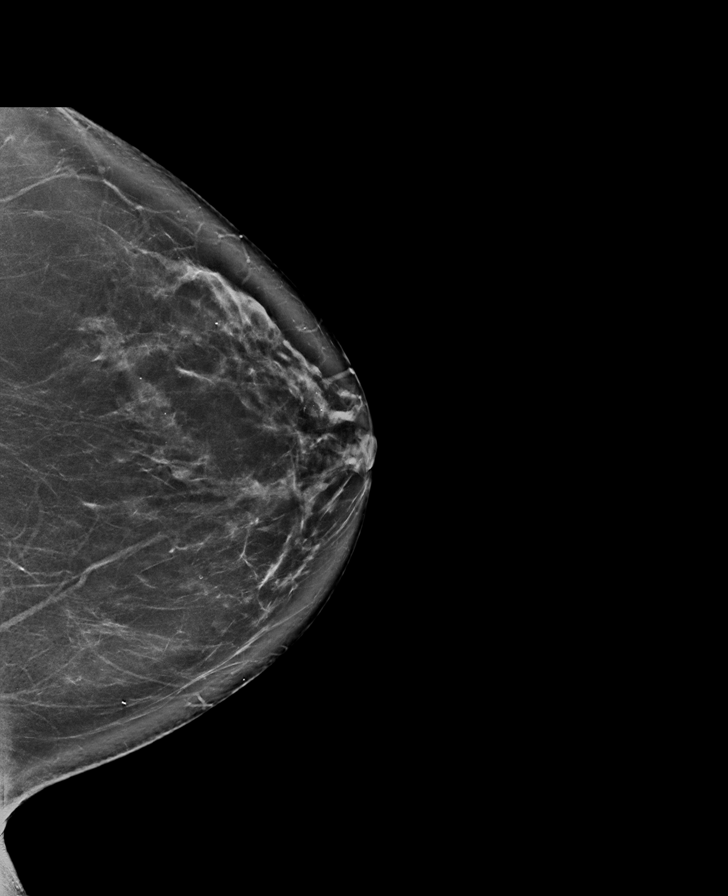

[L MLO tomo · tomo slice 42/83.0]
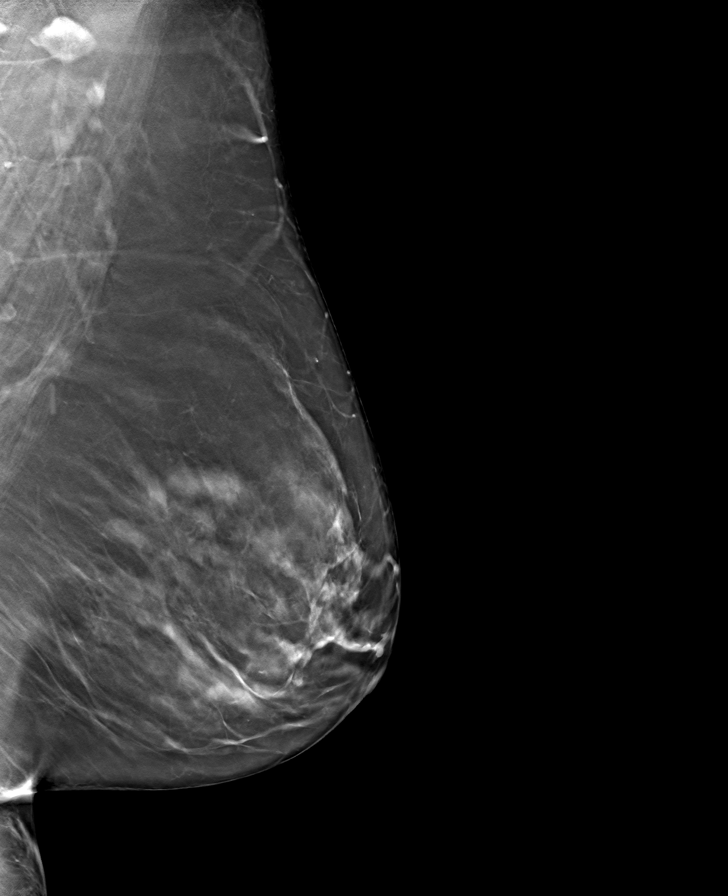

[L CC tomo · tomo slice 44/87.0]
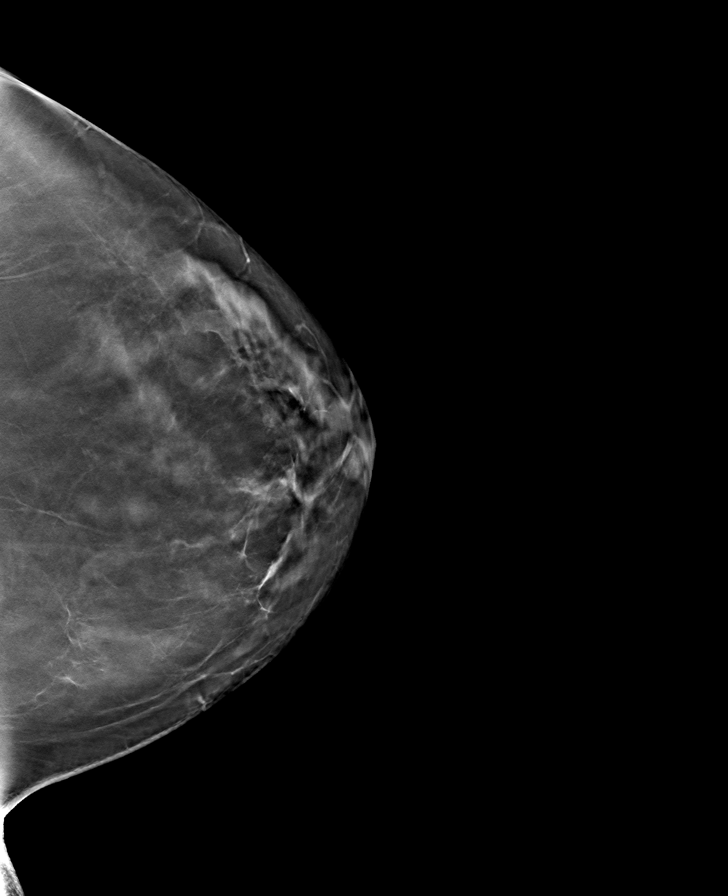

[R CC tomo · tomo slice 39/77.0]
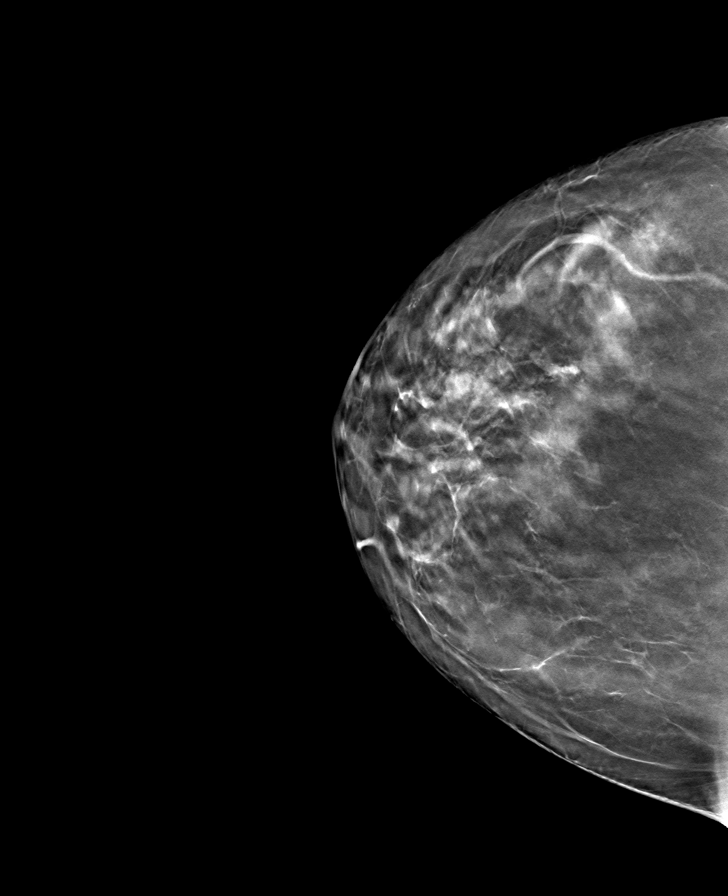

[R MLO tomo · tomo slice 43/84.0]
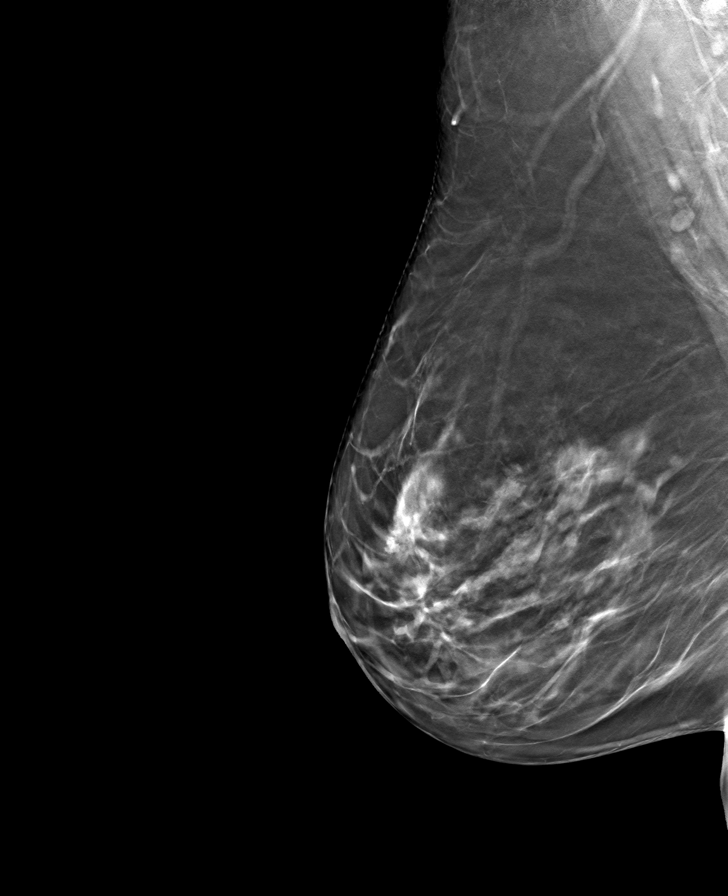

[8 of 24 positions shown; findings below may reference images not displayed]

ACR Breast Density Category b: There are scattered areas of
fibroglandular density.
FINDINGS: There are no findings suspicious for malignancy. Images were
processed with CAD.
IMPRESSION: No mammographic evidence of malignancy. A result letter of this
screening mammogram will be mailed directly to the patient.

RECOMMENDATION:
Screening mammogram in one year. (Code:[id number]-775)

BI-RADS CATEGORY  1: Negative.

## 2022-09-03 ENCOUNTER — Ambulatory Visit (AMBULATORY_SURGERY_CENTER): Payer: No Typology Code available for payment source | Admitting: Gastroenterology

## 2022-09-03 ENCOUNTER — Encounter: Payer: Self-pay | Admitting: Gastroenterology

## 2022-09-03 VITALS — BP 113/52 | HR 71 | Temp 97.5°F | Resp 16 | Ht 65.0 in | Wt 179.0 lb

## 2022-09-03 DIAGNOSIS — K219 Gastro-esophageal reflux disease without esophagitis: Secondary | ICD-10-CM | POA: Diagnosis not present

## 2022-09-03 DIAGNOSIS — K7460 Unspecified cirrhosis of liver: Secondary | ICD-10-CM

## 2022-09-03 DIAGNOSIS — D122 Benign neoplasm of ascending colon: Secondary | ICD-10-CM

## 2022-09-03 DIAGNOSIS — Z86010 Personal history of colonic polyps: Secondary | ICD-10-CM

## 2022-09-03 DIAGNOSIS — Z09 Encounter for follow-up examination after completed treatment for conditions other than malignant neoplasm: Secondary | ICD-10-CM | POA: Diagnosis present

## 2022-09-03 DIAGNOSIS — K229 Disease of esophagus, unspecified: Secondary | ICD-10-CM

## 2022-09-03 DIAGNOSIS — K635 Polyp of colon: Secondary | ICD-10-CM

## 2022-09-03 DIAGNOSIS — K449 Diaphragmatic hernia without obstruction or gangrene: Secondary | ICD-10-CM

## 2022-09-03 DIAGNOSIS — K208 Other esophagitis without bleeding: Secondary | ICD-10-CM

## 2022-09-03 MED ORDER — SODIUM CHLORIDE 0.9 % IV SOLN
500.0000 mL | Freq: Once | INTRAVENOUS | Status: DC
Start: 2022-09-03 — End: 2022-09-03

## 2022-09-03 NOTE — Progress Notes (Signed)
Sedate, gd SR, tolerated procedure well, VSS, report to RN 

## 2022-09-03 NOTE — Op Note (Signed)
[facility] Patient Name: Kim Holland Procedure Date: 09/03/2022 10:15 AM MRN: [id number] Endoscopist: Meryl DareMalcolm T Charee Tumblin , MD, [phone] Age: 62 Referring MD:  Date of Birth: 03/08/1961 Gender: Female Account #: [id number] Procedure:                Upper GI endoscopy Indications:              Screening procedure: cirrhosis screen for varices,                            GERD Medicines:                Monitored Anesthesia Care Procedure:                Pre-Anesthesia Assessment:                           - Prior to the procedure, a History and Physical                            was performed, and patient medications and                            allergies were reviewed. The patient's tolerance of                            previous anesthesia was also reviewed. The risks                            and benefits of the procedure and the sedation                            options and risks were discussed with the patient.                            All questions were answered, and informed consent                            was obtained. Prior Anticoagulants: The patient has                            taken no anticoagulant or antiplatelet agents. ASA                            Grade Assessment: III - A patient with severe                            systemic disease. After reviewing the risks and                            benefits, the patient was deemed in satisfactory                            condition to undergo the procedure.  After obtaining informed consent, the endoscope was                            passed under direct vision. Throughout the                            procedure, the patient's blood pressure, pulse, and                            oxygen saturations were monitored continuously. The                            Olympus Scope [id number] was introduced through the                            mouth, and advanced to the second part of  duodenum.                            The upper GI endoscopy was accomplished without                            difficulty. The patient tolerated the procedure                            well. Scope In: Scope Out: Findings:                 The Z-line was variable 1-2 cm and was found at the                            gastroesophageal junction. Biopsies were taken with                            a cold forceps for histology.                           The exam of the esophagus was otherwise normal.                           A small hiatal hernia was present.                           The exam of the stomach was otherwise normal.                           The duodenal bulb and second portion of the                            duodenum were normal. Complications:            No immediate complications. Estimated Blood Loss:     Estimated blood loss was minimal. Impression:               - Z-line variable, at the gastroesophageal  junction. Biopsied.                           - Small hiatal hernia.                           - Normal duodenal bulb and second portion of the                            duodenum. Recommendation:           - Patient has a contact number available for                            emergencies. The signs and symptoms of potential                            delayed complications were discussed with the                            patient. Return to normal activities tomorrow.                            Written discharge instructions were provided to the                            patient.                           - Resume previous diet.                           - Follow antireflux measures.                           - Continue present medications.                           - Await pathology results. Meryl Dare, MD 09/03/2022 11:11:06 AM This report has been signed electronically.

## 2022-09-03 NOTE — Patient Instructions (Signed)
-Handout on polyps and hiatal hernia provided -await pathology results -repeat colonoscopy for surveillance recommended. Date to be determined when pathology result become available   -Continue present medications   YOU HAD AN ENDOSCOPIC PROCEDURE TODAY AT THE [facility]:   Refer to the procedure report that was given to you for any specific questions about what was found during the examination.  If the procedure report does not answer your questions, please call your gastroenterologist to clarify.  If you requested that your care partner not be given the details of your procedure findings, then the procedure report has been included in a sealed envelope for you to review at your convenience later.  YOU SHOULD EXPECT: Some feelings of bloating in the abdomen. Passage of more gas than usual.  Walking can help get rid of the air that was put into your GI tract during the procedure and reduce the bloating. If you had a lower endoscopy (such as a colonoscopy or flexible sigmoidoscopy) you may notice spotting of blood in your stool or on the toilet paper. If you underwent a bowel prep for your procedure, you may not have a normal bowel movement for a few days.  Please Note:  You might notice some irritation and congestion in your nose or some drainage.  This is from the oxygen used during your procedure.  There is no need for concern and it should clear up in a day or so.  SYMPTOMS TO REPORT IMMEDIATELY:  Following lower endoscopy (colonoscopy or flexible sigmoidoscopy):  Excessive amounts of blood in the stool  Significant tenderness or worsening of abdominal pains  Swelling of the abdomen that is new, acute  Fever of 100F or higher  Following upper endoscopy (EGD)  Vomiting of blood or coffee ground material  New chest pain or pain under the shoulder blades  Painful or persistently difficult swallowing  New shortness of breath  Fever of 100F or higher  Black, tarry-looking  stools  For urgent or emergent issues, a gastroenterologist can be reached at any hour by calling [phone]. Do not use MyChart messaging for urgent concerns.    DIET:  We do recommend a small meal at first, but then you may proceed to your regular diet.  Drink plenty of fluids but you should avoid alcoholic beverages for 24 hours.  ACTIVITY:  You should plan to take it easy for the rest of today and you should NOT DRIVE or use heavy machinery until tomorrow (because of the sedation medicines used during the test).    FOLLOW UP: Our staff will call the number listed on your records the next business day following your procedure.  We will call around 7:15- 8:00 am to check on you and address any questions or concerns that you may have regarding the information given to you following your procedure. If we do not reach you, we will leave a message.     If any biopsies were taken you will be contacted by phone or by letter within the next 1-3 weeks.  Please call us at [phone] if you have not heard about the biopsies in 3 weeks.    SIGNATURES/CONFIDENTIALITY: You and/or your care partner have signed paperwork which will be entered into your electronic medical record.  These signatures attest to the fact that that the information above on your After Visit Summary has been reviewed and is understood.  Full responsibility of the confidentiality of this discharge information lies with you and/or your care-partner.

## 2022-09-03 NOTE — Progress Notes (Signed)
Called to room to assist during endoscopic procedure.  Patient ID and intended procedure confirmed with present staff. Received instructions for my participation in the procedure from the performing physician.  

## 2022-09-03 NOTE — Progress Notes (Signed)
History & Physical  Primary Care Physician:  Fatima Sanger, FNP Primary Gastroenterologist: Claudette Head, MD  Impression / Plan:  Decompensated cirrhosis screen for varices with EGD.  Personal history of adenomatous colon polyps for surveillance colonoscopy.  CHIEF COMPLAINT: Screening for varices, Personal history of colon polyps   HPI: Kim Holland is a 62 y.o. female with decompensated cirrhosis screen for varices with EGD.  Personal history of adenomatous colon polyps for surveillance colonoscopy.   Past Medical History:  Diagnosis Date   Allergy    Anxiety    GERD (gastroesophageal reflux disease)    Hyperlipidemia    Hypertension    Liver failure 06/17/2022   Pneumatouria    Seizures    only 1 approximately 3 years ago   Skin tags, anus or rectum    UTI (urinary tract infection)     Past Surgical History:  Procedure Laterality Date   DENTAL IMPLANTS     IR PARACENTESIS  06/18/2022   IR PARACENTESIS  07/07/2022    Prior to Admission medications   Medication Sig Start Date End Date Taking? Authorizing Provider  ALPRAZolam Prudy Feeler) 1 MG tablet Take 1 tablet by mouth the morning of procedure 08/31/22  Yes Lemmon, Violet Baldy, PA  cyanocobalamin (VITAMIN B12) 1000 MCG tablet Take 1,000 mcg by mouth daily.   Yes [provider]  escitalopram (LEXAPRO) 5 MG tablet Take 10 mg by mouth daily.   Yes [provider]  hydrOXYzine (ATARAX/VISTARIL) 25 MG tablet Take 25 mg by mouth 3 (three) times daily.   Yes [provider]  lactulose (CHRONULAC) 10 GM/15ML solution Take 15 mLs (10 g total) by mouth daily. Ensure 2-3 soft bowel movements daily Patient taking differently: Take 30 g by mouth daily. Ensure 2-3 soft bowel movements daily 06/25/22 09/23/22 Yes [place], Alvira Philips, DO  Melatonin 10 MG TBCR Take 10 capsules by mouth at bedtime.   Yes [provider]  pantoprazole (PROTONIX) 40 MG tablet Take 1 tablet (40 mg total) by mouth  daily. 06/25/22 09/23/22 Yes [place], Eric J, DO  rifaximin (XIFAXAN) 550 MG TABS tablet Take 1 tablet (550 mg total) by mouth 2 (two) times daily. 06/24/22 09/22/22 Yes [place], Eric J, DO  EPINEPHrine (EPIPEN 2-PAK) 0.3 mg/0.3 mL IJ SOAJ injection EpiPen 2-Pak 0.3 mg/0.3 mL injection, auto-injector  use as directed by prescriber    [provider]    Current Outpatient Medications  Medication Sig Dispense Refill   ALPRAZolam (XANAX) 1 MG tablet Take 1 tablet by mouth the morning of procedure 1 tablet 0   cyanocobalamin (VITAMIN B12) 1000 MCG tablet Take 1,000 mcg by mouth daily.     escitalopram (LEXAPRO) 5 MG tablet Take 10 mg by mouth daily.     hydrOXYzine (ATARAX/VISTARIL) 25 MG tablet Take 25 mg by mouth 3 (three) times daily.     lactulose (CHRONULAC) 10 GM/15ML solution Take 15 mLs (10 g total) by mouth daily. Ensure 2-3 soft bowel movements daily (Patient taking differently: Take 30 g by mouth daily. Ensure 2-3 soft bowel movements daily) 450 mL 2   Melatonin 10 MG TBCR Take 10 capsules by mouth at bedtime.     pantoprazole (PROTONIX) 40 MG tablet Take 1 tablet (40 mg total) by mouth daily. 30 tablet 2   rifaximin (XIFAXAN) 550 MG TABS tablet Take 1 tablet (550 mg total) by mouth 2 (two) times daily. 60 tablet 2   EPINEPHrine (EPIPEN 2-PAK) 0.3 mg/0.3 mL IJ SOAJ injection EpiPen  2-Pak 0.3 mg/0.3 mL injection, auto-injector  use as directed by prescriber     Current Facility-Administered Medications  Medication Dose Route Frequency Provider Last Rate Last Admin   0.9 %  sodium chloride infusion  500 mL Intravenous Once Meryl Dare, MD        Allergies as of 09/03/2022 - Review Complete 09/03/2022  Allergen Reaction Noted   Latex  10/03/2013   Doxycycline Other (See Comments) 06/17/2022   Penicillin g sodium Other (See Comments) 06/17/2022   Ampicillin Rash 06/17/2022    Family History  Problem Relation Age of Onset   Breast cancer Mother    Hypertension Mother     Hypertension Father    Hypertension Brother    Colon polyps Brother    Colon cancer Paternal Grandmother    Colon cancer Paternal Aunt    Esophageal cancer Neg Hx    Liver disease Neg Hx    Stomach cancer Neg Hx     Social History   Socioeconomic History   Marital status: Married    Spouse name: Not on file   Number of children: 0   Years of education: Not on file   Highest education level: Not on file  Occupational History   Not on file  Tobacco Use   Smoking status: Former    Types: Cigarettes    Quit date: 06/11/2016    Years since quitting: 6.2   Smokeless tobacco: Never  Substance and Sexual Activity   Alcohol use: Not on file    Comment: 0   Drug use: No   Sexual activity: Not on file  Other Topics Concern   Not on file  Social History Narrative   Not on file   Social Determinants of Health   Financial Resource Strain: Not on file  Food Insecurity: No Food Insecurity (06/24/2022)   Hunger Vital Sign    Worried About Running Out of Food in the Last Year: Never true    Ran Out of Food in the Last Year: Never true  Transportation Needs: No Transportation Needs (06/24/2022)   PRAPARE - Administrator, Civil Service (Medical): No    Lack of Transportation (Non-Medical): No  Physical Activity: Not on file  Stress: Not on file  Social Connections: Not on file  Intimate Partner Violence: Not At Risk (06/24/2022)   Humiliation, Afraid, Rape, and Kick questionnaire    Fear of Current or Ex-Partner: No    Emotionally Abused: No    Physically Abused: No    Sexually Abused: No    Review of Systems:  All systems reviewed were negative except where noted in HPI.   Physical Exam: General:  Alert, well-developed, in NAD Head:  Normocephalic and atraumatic. Eyes:  Sclera clear, no icterus.   Conjunctiva pink. Ears:  Normal auditory acuity. Mouth:  No deformity or lesions.  Neck:  Supple; no masses. Lungs:  Clear throughout to auscultation.   No  wheezes, crackles, or rhonchi.  Heart:  Regular rate and rhythm; no murmurs. Abdomen:  Soft, nondistended, nontender. No masses, hepatomegaly. No palpable masses.  Normal bowel sounds.    Rectal:  Deferred   Msk:  Symmetrical without gross deformities. Extremities:  Without edema. Neurologic:  Alert and  oriented x 4; grossly normal neurologically. Skin:  Intact without significant lesions or rashes. Psych:  Alert and cooperative. Normal mood and affect.   Venita Lick. Russella Dar  09/03/2022, 10:18 AM See Loretha Stapler, [facility], to contact our on call provider

## 2022-09-03 NOTE — Op Note (Signed)
[facility] Patient Name: Kim Holland Procedure Date: 09/03/2022 10:21 AM MRN: [id number] Endoscopist: Meryl Dare , MD, [id number] Age: 62 Referring MD:  Date of Birth: 1960-09-24 Gender: Female Account #: [id number] Procedure:                Colonoscopy Indications:              Surveillance: Personal history of adenomatous                            polyps on last colonoscopy 5 years ago Medicines:                Monitored Anesthesia Care Procedure:                Pre-Anesthesia Assessment:                           - Prior to the procedure, a History and Physical                            was performed, and patient medications and                            allergies were reviewed. The patient's tolerance of                            previous anesthesia was also reviewed. The risks                            and benefits of the procedure and the sedation                            options and risks were discussed with the patient.                            All questions were answered, and informed consent                            was obtained. Prior Anticoagulants: The patient has                            taken no anticoagulant or antiplatelet agents. ASA                            Grade Assessment: III - A patient with severe                            systemic disease. After reviewing the risks and                            benefits, the patient was deemed in satisfactory                            condition to undergo the procedure.  After obtaining informed consent, the colonoscope                            was passed under direct vision. Throughout the                            procedure, the patient's blood pressure, pulse, and                            oxygen saturations were monitored continuously. The                            Olympus CF-HQ190L ([id number]) Colonoscope was                            introduced through the  anus and advanced to the the                            cecum, identified by appendiceal orifice and                            ileocecal valve. The ileocecal valve, appendiceal                            orifice, and rectum were photographed. The quality                            of the bowel preparation was good. The colonoscopy                            was performed without difficulty. The patient                            tolerated the procedure well. Scope In: 10:31:34 AM Scope Out: 10:54:16 AM Scope Withdrawal Time: 0 hours 18 minutes 56 seconds  Total Procedure Duration: 0 hours 22 minutes 42 seconds  Findings:                 The perianal and digital rectal examinations were                            normal.                           A 4 mm polyp was found in the ascending colon. The                            polyp was sessile. The polyp was removed with a                            cold biopsy forceps. Resection and retrieval were                            complete.  The exam was otherwise without abnormality on                            direct and retroflexion views. Complications:            No immediate complications. Estimated blood loss:                            None. Estimated Blood Loss:     Estimated blood loss: none. Impression:               - One 4 mm polyp in the ascending colon, removed                            with a cold biopsy forceps. Resected and retrieved.                           - The examination was otherwise normal on direct                            and retroflexion views. Recommendation:           - Repeat colonoscopy after studies are complete for                            surveillance based on pathology results.                           - Patient has a contact number available for                            emergencies. The signs and symptoms of potential                            delayed complications were  discussed with the                            patient. Return to normal activities tomorrow.                            Written discharge instructions were provided to the                            patient.                           - Resume previous diet.                           - Continue present medications.                           - Await pathology results. Meryl DareMalcolm T Riki Berninger, MD 09/03/2022 11:04:51 AM This report has been signed electronically.

## 2022-09-04 ENCOUNTER — Telehealth: Payer: Self-pay | Admitting: *Deleted

## 2022-09-04 NOTE — Telephone Encounter (Signed)
  Follow up Call-     09/03/2022    9:57 AM  Call back number  Post procedure Call Back phone  # [phone]  Permission to leave phone message Yes     Patient questions:  Do you have a fever, pain , or abdominal swelling? No. Pain Score  0 *  Have you tolerated food without any problems? Yes.    Have you been able to return to your normal activities? Yes.    Do you have any questions about your discharge instructions: Diet   No. Medications  No. Follow up visit  No.  Do you have questions or concerns about your Care? No.  Actions: * If pain score is 4 or above: No action needed, pain <4.

## 2022-09-14 ENCOUNTER — Encounter: Payer: Self-pay | Admitting: Gastroenterology

## 2022-09-28 ENCOUNTER — Other Ambulatory Visit: Payer: Self-pay | Admitting: Nurse Practitioner

## 2022-09-28 DIAGNOSIS — K7030 Alcoholic cirrhosis of liver without ascites: Secondary | ICD-10-CM

## 2022-09-28 DIAGNOSIS — R188 Other ascites: Secondary | ICD-10-CM

## 2022-09-28 DIAGNOSIS — K7011 Alcoholic hepatitis with ascites: Secondary | ICD-10-CM

## 2022-09-28 DIAGNOSIS — K7682 Hepatic encephalopathy: Secondary | ICD-10-CM

## 2022-09-28 DIAGNOSIS — K766 Portal hypertension: Secondary | ICD-10-CM

## 2022-11-02 ENCOUNTER — Ambulatory Visit
Admission: RE | Admit: 2022-11-02 | Discharge: 2022-11-02 | Disposition: A | Discharge status: Home / Self Care | Payer: No Typology Code available for payment source | Source: Ambulatory Visit | Attending: Nurse Practitioner | Admitting: Nurse Practitioner

## 2022-11-02 DIAGNOSIS — R188 Other ascites: Secondary | ICD-10-CM

## 2022-11-02 DIAGNOSIS — K7030 Alcoholic cirrhosis of liver without ascites: Secondary | ICD-10-CM

## 2022-11-02 DIAGNOSIS — K7011 Alcoholic hepatitis with ascites: Secondary | ICD-10-CM

## 2022-11-02 DIAGNOSIS — K766 Portal hypertension: Secondary | ICD-10-CM

## 2022-11-02 DIAGNOSIS — K7682 Hepatic encephalopathy: Secondary | ICD-10-CM

## 2022-11-03 ENCOUNTER — Other Ambulatory Visit: Payer: Self-pay | Admitting: Nurse Practitioner

## 2022-11-03 DIAGNOSIS — D376 Neoplasm of uncertain behavior of liver, gallbladder and bile ducts: Secondary | ICD-10-CM

## 2022-12-03 ENCOUNTER — Ambulatory Visit
Admission: RE | Admit: 2022-12-03 | Discharge: 2022-12-03 | Disposition: A | Discharge status: Home / Self Care | Payer: No Typology Code available for payment source | Source: Ambulatory Visit | Attending: Nurse Practitioner | Admitting: Nurse Practitioner

## 2022-12-03 DIAGNOSIS — D376 Neoplasm of uncertain behavior of liver, gallbladder and bile ducts: Secondary | ICD-10-CM

## 2022-12-03 MED ORDER — IOPAMIDOL (ISOVUE-300) INJECTION 61%
100.0000 mL | Freq: Once | INTRAVENOUS | Status: AC | PRN
  Administered 2022-12-03: 100 mL via INTRAVENOUS

## 2022-12-04 ENCOUNTER — Emergency Department (HOSPITAL_BASED_OUTPATIENT_CLINIC_OR_DEPARTMENT_OTHER): Payer: No Typology Code available for payment source | Admitting: Radiology

## 2022-12-04 ENCOUNTER — Encounter (HOSPITAL_BASED_OUTPATIENT_CLINIC_OR_DEPARTMENT_OTHER): Payer: Self-pay

## 2022-12-04 ENCOUNTER — Other Ambulatory Visit: Payer: Self-pay

## 2022-12-04 ENCOUNTER — Other Ambulatory Visit (HOSPITAL_BASED_OUTPATIENT_CLINIC_OR_DEPARTMENT_OTHER): Payer: Self-pay

## 2022-12-04 ENCOUNTER — Emergency Department (HOSPITAL_BASED_OUTPATIENT_CLINIC_OR_DEPARTMENT_OTHER)
Admission: EM | Admit: 2022-12-04 | Discharge: 2022-12-04 | Disposition: A | Discharge status: Home / Self Care | Payer: No Typology Code available for payment source | Attending: Emergency Medicine | Admitting: Emergency Medicine

## 2022-12-04 DIAGNOSIS — I10 Essential (primary) hypertension: Secondary | ICD-10-CM | POA: Insufficient documentation

## 2022-12-04 DIAGNOSIS — Z87891 Personal history of nicotine dependence: Secondary | ICD-10-CM | POA: Insufficient documentation

## 2022-12-04 DIAGNOSIS — S46011A Strain of muscle(s) and tendon(s) of the rotator cuff of right shoulder, initial encounter: Secondary | ICD-10-CM | POA: Insufficient documentation

## 2022-12-04 DIAGNOSIS — X58XXXA Exposure to other specified factors, initial encounter: Secondary | ICD-10-CM | POA: Diagnosis not present

## 2022-12-04 DIAGNOSIS — Z79899 Other long term (current) drug therapy: Secondary | ICD-10-CM | POA: Diagnosis not present

## 2022-12-04 DIAGNOSIS — M25511 Pain in right shoulder: Secondary | ICD-10-CM | POA: Diagnosis present

## 2022-12-04 DIAGNOSIS — Z91040 Latex allergy status: Secondary | ICD-10-CM | POA: Insufficient documentation

## 2022-12-04 DIAGNOSIS — M19011 Primary osteoarthritis, right shoulder: Secondary | ICD-10-CM | POA: Diagnosis not present

## 2022-12-04 DIAGNOSIS — S46911A Strain of unspecified muscle, fascia and tendon at shoulder and upper arm level, right arm, initial encounter: Secondary | ICD-10-CM

## 2022-12-04 MED ORDER — LIDOCAINE 5 % EX PTCH
1.0000 | MEDICATED_PATCH | Freq: Once | CUTANEOUS | Status: DC
  Administered 2022-12-04: 1 via TRANSDERMAL
  Filled 2022-12-04: qty 1

## 2022-12-04 MED ORDER — CYCLOBENZAPRINE HCL 10 MG PO TABS: 10.0000 mg | ORAL_TABLET | Freq: Two times a day (BID) | ORAL | 0 refills | Status: DC | PRN

## 2022-12-04 MED ORDER — ACETAMINOPHEN 325 MG PO TABS: 650.0000 mg | ORAL_TABLET | Freq: Four times a day (QID) | ORAL | 0 refills | Status: DC | PRN

## 2022-12-04 MED ORDER — OXYCODONE HCL 5 MG PO TABS: 5.0000 mg | ORAL_TABLET | Freq: Four times a day (QID) | ORAL | 0 refills | Status: DC | PRN

## 2022-12-04 MED ORDER — LIDOCAINE 5 % EX PTCH: 1.0000 | MEDICATED_PATCH | Freq: Every day | CUTANEOUS | 0 refills | Status: DC | PRN

## 2022-12-04 NOTE — Discharge Instructions (Addendum)
It was a pleasure caring for you today in the emergency department.  Please return to the emergency department for any worsening or worrisome symptoms.  No heavy lifting right shoulder/arm next 7 days  Please follow up with orthopedics

## 2022-12-04 NOTE — ED Provider Notes (Signed)
[facility] EMERGENCY DEPARTMENT AT [facility] Provider Note  CSN: [id number] Arrival date & time: 12/04/22 1021  Chief Complaint(s) Shoulder Pain  HPI Kim Holland is a 62 y.o. female with past medical history as below, significant for GERD, HLD, HTN, alcoholic cirrhosis who presents to the ED with complaint of right shoulder pain.  Symptom onset approxi-1 week ago after pushing her mother wheelchair.  Pain primarily to the anterior portion of her right shoulder, worse with movement.  She has been using her arm more than normal as she has been helping her mother with her ADLs at home.  She has tried Tylenol which provided mild improvement to her symptoms.  She feels the pain is mildly improved since the onset.  No recent falls or traumatic injuries.  No pain with neck movement, no numbness or tingling to her hand or to her extremity.  No nausea or vomiting, chest pain, rashes, fevers or chills.  Past Medical History Past Medical History:  Diagnosis Date   Allergy    Anxiety    GERD (gastroesophageal reflux disease)    Hyperlipidemia    Hypertension    Liver failure (HCC) 06/17/2022   Pneumatouria    Seizures (HCC)    only 1 approximately 3 years ago   Skin tags, anus or rectum    UTI (urinary tract infection)    Patient Active Problem List   Diagnosis Date Noted   Alcoholic cirrhosis of liver with ascites (HCC) 06/18/2022   Alcoholic hepatitis with ascites 06/18/2022   Acute hepatic failure 06/17/2022   GERD (gastroesophageal reflux disease) 06/17/2022   Hyperlipidemia 06/17/2022   Hyponatremia 06/17/2022   Hypokalemia 06/17/2022   Leucocytosis 06/17/2022   Alcohol abuse 06/17/2022   AKI (acute kidney injury) (HCC) 06/17/2022   Essential hypertension 12/25/2013   Seizure (HCC) 12/25/2013   Home Medication(s) Prior to Admission medications   Medication Sig Start Date End Date Taking? Authorizing Provider  acetaminophen (TYLENOL) 325 MG tablet Take 2 tablets  (650 mg total) by mouth every 6 (six) hours as needed. 12/04/22  Yes Tanda Rockers A, DO  cyclobenzaprine (FLEXERIL) 10 MG tablet Take 1 tablet (10 mg total) by mouth 2 (two) times daily as needed for muscle spasms. 12/04/22  Yes Tanda Rockers A, DO  furosemide (LASIX) 20 MG tablet Take 2 tablets by mouth daily. 10/20/22 10/20/23 Yes [provider]  lidocaine (LIDODERM) 5 % Place 1 patch onto the skin daily as needed. Remove & Discard patch within 12 hours or as directed by MD 12/04/22  Yes Tanda Rockers A, DO  oxyCODONE (ROXICODONE) 5 MG immediate release tablet Take 1 tablet (5 mg total) by mouth every 6 (six) hours as needed for severe pain. 12/04/22  Yes Tanda Rockers A, DO  pravastatin (PRAVACHOL) 10 MG tablet Take 10 mg by mouth daily. 07/27/22  Yes [provider]  spironolactone (ALDACTONE) 50 MG tablet Take 100 mg by mouth daily. 10/20/22 10/20/23 Yes [provider]  ALPRAZolam Prudy Feeler) 1 MG tablet Take 1 tablet by mouth the morning of procedure 08/31/22   Unk Lightning, PA  cyanocobalamin (VITAMIN B12) 1000 MCG tablet Take 1,000 mcg by mouth daily.    [provider]  EPINEPHrine (EPIPEN 2-PAK) 0.3 mg/0.3 mL IJ SOAJ injection EpiPen 2-Pak 0.3 mg/0.3 mL injection, auto-injector  use as directed by prescriber    [provider]  escitalopram (LEXAPRO) 5 MG tablet Take 10 mg by mouth daily.    [provider]  folic acid (  FOLVITE) 1 MG tablet Take 1 mg by mouth daily.    [provider]  hydrOXYzine (ATARAX/VISTARIL) 25 MG tablet Take 25 mg by mouth 3 (three) times daily.    [provider]  Melatonin 10 MG TBCR Take 10 capsules by mouth at bedtime.    [provider]  pantoprazole (PROTONIX) 40 MG tablet Take 1 tablet (40 mg total) by mouth daily. 06/25/22 09/23/22  [place], Kim J, DO                                                                                                                                    Past  Surgical History Past Surgical History:  Procedure Laterality Date   DENTAL IMPLANTS     IR PARACENTESIS  06/18/2022   IR PARACENTESIS  07/07/2022   Family History Family History  Problem Relation Age of Onset   Breast cancer Mother    Hypertension Mother    Hypertension Father    Hypertension Brother    Colon polyps Brother    Colon cancer Paternal Grandmother    Colon cancer Paternal Aunt    Esophageal cancer Neg Hx    Liver disease Neg Hx    Stomach cancer Neg Hx     Social History Social History   Tobacco Use   Smoking status: Former    Current packs/day: 0.00    Types: Cigarettes    Quit date: 06/11/2016    Years since quitting: 6.4   Smokeless tobacco: Never  Substance Use Topics   Alcohol use: Not Currently    Comment: 0   Drug use: No   Allergies Latex, Doxycycline, Penicillin g sodium, and Ampicillin  Review of Systems Review of Systems  Constitutional:  Negative for chills and fever.  Respiratory:  Negative for chest tightness and shortness of breath.   Cardiovascular:  Negative for chest pain.  Gastrointestinal:  Negative for abdominal pain.  Musculoskeletal:  Positive for arthralgias. Negative for neck stiffness.  Skin:  Negative for rash and wound.  All other systems reviewed and are negative.   Physical Exam Vital Signs  I have reviewed the triage vital signs BP 110/80 (BP Location: Right Arm)   Pulse 67   Temp 98.6 F (37 C) (Oral)   Resp 18   Ht 5\' 5"  (1.651 m)   Wt 64 kg   SpO2 99%   BMI 23.46 kg/m  Physical Exam Vitals and nursing note reviewed.  Constitutional:      General: She is not in acute distress.    Appearance: Normal appearance. She is well-developed. She is not ill-appearing.  HENT:     Head: Normocephalic and atraumatic.     Right Ear: External ear normal.     Left Ear: External ear normal.     Nose: Nose normal.     Mouth/Throat:     Mouth: Mucous membranes are moist.  Eyes:  General: No scleral icterus.        Right eye: No discharge.        Left eye: No discharge.  Cardiovascular:     Rate and Rhythm: Normal rate.  Pulmonary:     Effort: Pulmonary effort is normal. No respiratory distress.     Breath sounds: No stridor.  Abdominal:     General: Abdomen is flat. There is no distension.     Tenderness: There is no guarding.  Musculoskeletal:        General: No deformity.       Arms:     Cervical back: Normal range of motion. No rigidity.     Comments: Pain with passive movement of RUE. Mildly reduced active ROM, able to lift arm >90 degrees.   Skin:    General: Skin is warm and dry.     Coloration: Skin is not cyanotic, jaundiced or pale.  Neurological:     Mental Status: She is alert and oriented to person, place, and time.     GCS: GCS eye subscore is 4. GCS verbal subscore is 5. GCS motor subscore is 6.  Psychiatric:        Speech: Speech normal.        Behavior: Behavior normal. Behavior is cooperative.     ED Results and Treatments Labs (all labs ordered are listed, but only abnormal results are displayed) Labs Reviewed - No data to display                                                                                                                        Radiology DG Shoulder Right  Result Date: 12/04/2022 CLINICAL DATA:  Pain right shoulder EXAM: RIGHT SHOULDER - 2+ VIEW COMPARISON:  None Available. FINDINGS: No recent fracture or dislocation is seen. Small bony spurs are noted in glenohumeral and AC joints. There are a few calcifications along the anterior aspect of proximal humerus. There are no lytic lesions. IMPRESSION: No recent fracture or dislocation is seen in right shoulder. Degenerative changes are noted with bony spurs in right shoulder and AC joints. There are calcifications in the soft tissues along the anterior aspect of proximal humerus, possibly loose bodies in the joint due to degenerative arthritis. Electronically Signed   By: Ernie Avena M.D.   On:  12/04/2022 12:46    Pertinent labs & imaging results that were available during my care of the patient were reviewed by me and considered in my medical decision making (see MDM for details).  Medications Ordered in ED Medications  lidocaine (LIDODERM) 5 % 1 patch (1 patch Transdermal Patch Applied 12/04/22 1259)  Procedures Procedures  (including critical care time)  Medical Decision Making / ED Course    Medical Decision Making:    SYDNY LOBELLO is a 62 y.o. female with hx as above here with right shoulder pain. The complaint involves an extensive differential diagnosis and also carries with it a high risk of complications and morbidity.  Serious etiology was considered. Ddx includes but is not limited to: sprain, strain, msk, fx, arthritis, tendonitis, dislocation etc  Complete initial physical exam performed, notably the patient  was NAD, sitting upright, RUE NVI.    Reviewed and confirmed nursing documentation for past medical history, family history, social history.  Vital signs reviewed.      Patient here with right-sided shoulder pain after recent increase in her shoulder activity.  X-ray concerning for degenerative arthritis, bone spurs noted as well.  Placed in sling.  Discussed supportive care at home including using a muscle relaxer, follow-up orthopedics.  Activity restrictions encouraged, no heavy lifting 7 days. Pain today favored to be MSK in etiology, RUE is NVI. RUE is warm/well perfused. Reports she cannot take nsaids.   The patient improved significantly and was discharged in stable condition. Detailed discussions were had with the patient regarding current findings, and need for close f/u with PCP or on call doctor. The patient has been instructed to return immediately if the symptoms worsen in any way for re-evaluation. Patient  verbalized understanding and is in agreement with current care plan. All questions answered prior to discharge.          Additional history obtained: -Additional history obtained from na -External records from outside source obtained and reviewed including: Chart review including previous notes, labs, imaging, consultation notes including home meds   Lab Tests: na  EKG   EKG Interpretation Date/Time:    Ventricular Rate:    PR Interval:    QRS Duration:    QT Interval:    QTC Calculation:   R Axis:      Text Interpretation:           Imaging Studies ordered: I ordered imaging studies including shoulder xr I independently visualized the following imaging with scope of interpretation limited to determining acute life threatening conditions related to emergency care; findings noted above, significant for degenerative arthritis right shoulder I independently visualized and interpreted imaging. I agree with the radiologist interpretation   Medicines ordered and prescription drug management: Meds ordered this encounter  Medications   lidocaine (LIDODERM) 5 % 1 patch   acetaminophen (TYLENOL) 325 MG tablet    Sig: Take 2 tablets (650 mg total) by mouth every 6 (six) hours as needed.    Dispense:  36 tablet    Refill:  0   cyclobenzaprine (FLEXERIL) 10 MG tablet    Sig: Take 1 tablet (10 mg total) by mouth 2 (two) times daily as needed for muscle spasms.    Dispense:  20 tablet    Refill:  0   oxyCODONE (ROXICODONE) 5 MG immediate release tablet    Sig: Take 1 tablet (5 mg total) by mouth every 6 (six) hours as needed for severe pain.    Dispense:  10 tablet    Refill:  0   lidocaine (LIDODERM) 5 %    Sig: Place 1 patch onto the skin daily as needed. Remove & Discard patch within 12 hours or as directed by MD    Dispense:  15 patch    Refill:  0    -I have reviewed  the patients home medicines and have made adjustments as needed   Consultations  Obtained: na   Cardiac Monitoring: na  Social Determinants of Health:  Diagnosis or treatment significantly limited by social determinants of health: former smoker   Reevaluation: After the interventions noted above, I reevaluated the patient and found that they have improved  Co morbidities that complicate the patient evaluation  Past Medical History:  Diagnosis Date   Allergy    Anxiety    GERD (gastroesophageal reflux disease)    Hyperlipidemia    Hypertension    Liver failure (HCC) 06/17/2022   Pneumatouria    Seizures (HCC)    only 1 approximately 3 years ago   Skin tags, anus or rectum    UTI (urinary tract infection)       Dispostion: Disposition decision including need for hospitalization was considered, and patient discharged from emergency department.    Final Clinical Impression(s) / ED Diagnoses Final diagnoses:  Strain of right shoulder, initial encounter  Osteoarthritis of right shoulder, unspecified osteoarthritis type     This chart was dictated using voice recognition software.  Despite best efforts to proofread,  errors can occur which can change the documentation meaning.    Tanda Rockers A, DO 12/04/22 1313

## 2022-12-04 NOTE — ED Triage Notes (Signed)
Onset last Friday  pushing a wheel chair and states hurt right arm.  Has pain to right shoulder that radiates down right arm.

## 2022-12-04 NOTE — ED Notes (Signed)
Discharge instructions, follow up care, prescriptions, and pain management reviewed and explained, pt verbalized understanding and had no further questions on d/c.  

## 2022-12-10 ENCOUNTER — Other Ambulatory Visit (HOSPITAL_COMMUNITY): Payer: Self-pay | Admitting: Registered Nurse

## 2022-12-10 DIAGNOSIS — Z8249 Family history of ischemic heart disease and other diseases of the circulatory system: Secondary | ICD-10-CM

## 2023-01-26 ENCOUNTER — Ambulatory Visit (HOSPITAL_BASED_OUTPATIENT_CLINIC_OR_DEPARTMENT_OTHER)
Admission: RE | Admit: 2023-01-26 | Discharge: 2023-01-26 | Disposition: A | Discharge status: Home / Self Care | Payer: No Typology Code available for payment source | Source: Ambulatory Visit | Attending: Registered Nurse | Admitting: Registered Nurse

## 2023-01-26 DIAGNOSIS — Z8249 Family history of ischemic heart disease and other diseases of the circulatory system: Secondary | ICD-10-CM | POA: Insufficient documentation

## 2023-02-19 ENCOUNTER — Other Ambulatory Visit: Payer: Self-pay | Admitting: Obstetrics and Gynecology

## 2023-02-19 DIAGNOSIS — Z1231 Encounter for screening mammogram for malignant neoplasm of breast: Secondary | ICD-10-CM

## 2023-02-25 ENCOUNTER — Encounter: Payer: Self-pay | Admitting: Cardiology

## 2023-02-25 ENCOUNTER — Ambulatory Visit: Payer: No Typology Code available for payment source | Attending: Cardiology | Admitting: Cardiology

## 2023-02-25 VITALS — BP 104/64 | HR 87 | Resp 16 | Ht 65.0 in | Wt 142.6 lb

## 2023-02-25 DIAGNOSIS — R931 Abnormal findings on diagnostic imaging of heart and coronary circulation: Secondary | ICD-10-CM | POA: Diagnosis not present

## 2023-02-25 DIAGNOSIS — K7030 Alcoholic cirrhosis of liver without ascites: Secondary | ICD-10-CM | POA: Diagnosis not present

## 2023-02-25 DIAGNOSIS — I10 Essential (primary) hypertension: Secondary | ICD-10-CM

## 2023-02-25 NOTE — Patient Instructions (Addendum)
Medication Instructions:  Your physician has recommended you make the following change in your medication:   1) CHANGE furosemide (Lasix) to 2 tablets daily as needed for swelling  *If you need a refill on your cardiac medications before your next appointment, please call your pharmacy*  Lab Work: None ordered today.  Testing/Procedures: None ordered today.  Follow-Up: At [facility], you and your health needs are our priority.  As part of our continuing mission to provide you with exceptional heart care, we have created designated Provider Care Teams.  These Care Teams include your primary Cardiologist (physician) and Advanced Practice Providers (APPs -  Physician Assistants and Nurse Practitioners) who all work together to provide you with the care you need, when you need it.  Your next appointment:   As needed  The format for your next appointment:   In Person  Provider:   Yates Decamp, MD {

## 2023-02-25 NOTE — Progress Notes (Signed)
Cardiology Office Note:  .   Date:  02/25/2023  ID:  Samuel Jester, DOB 1961/04/25, MRN [id number] PCP: Fatima Sanger, FNP  [facility] Providers Cardiologist:  Yates Decamp, MD    History of Present Illness: .   Kim Holland is a 62 y.o. female with alcoholic cirrhosis of the liver and was admitted with encephalopathy, ascites and acute liver failure in January 2024, she also had peritoneal centesis.  She has completely quit drinking alcohol since then and is now being evaluated for possible liver transplant at [facility].  She is referred to me for evaluation of elevated coronary calcium score.  Past medical history significant for hypertension.  She smoked remotely but just a few cigarettes a day and quit when she was 62 years of age.  There is no family history of premature coronary disease in direct family members.  She walks on a daily basis and gets about 10,000 steps a day.  She denies chest pain, dyspnea, dizziness or syncope.  Review of Systems  Cardiovascular:  Negative for chest pain, dyspnea on exertion and leg swelling.    Physical Exam Neck:     Vascular: No carotid bruit or JVD.  Cardiovascular:     Rate and Rhythm: Normal rate and regular rhythm.     Pulses: Intact distal pulses.     Heart sounds: Normal heart sounds. No murmur heard.    No gallop.  Pulmonary:     Effort: Pulmonary effort is normal.     Breath sounds: Normal breath sounds.  Abdominal:     General: Bowel sounds are normal.     Palpations: Abdomen is soft.  Musculoskeletal:     Right lower leg: No edema.     Left lower leg: No edema.      Risk Assessment/Calculations:              Lab Results  Component Value Date   NA 132 (L) 06/24/2022   K 3.9 06/24/2022   CO2 22 06/24/2022   GLUCOSE 105 (H) 06/24/2022   BUN 17 06/24/2022   CREATININE 0.78 06/24/2022   CALCIUM 7.9 (L) 06/24/2022   GFRNONAA >60 06/24/2022   Lab Results  Component Value Date   WBC 18.4 (H)  06/24/2022   HGB 13.0 06/24/2022   HCT 36.5 06/24/2022   MCV 97.1 06/24/2022   PLT 154 06/24/2022    External Labs:  Labs 09/28/2022: Pro time minimally elevated at 12.3 compared to 19 in January 2024.  Serum bilirubin 1.4, alkaline phosphatase minimally elevated at 158, AST mildly elevated at 47, ALT normal.  Hb 14.4/HCT 43.0, platelets 163.  Cholesterol, total 168.000 m 12/03/2022 HDL 65.000 mg 12/03/2022 LDL 88.000 mg 12/03/2022 Triglycerides 80.000 mg 12/03/2022  A1C 5.300 % 12/03/2022 TSH 1.550 12/03/2022  Physical Exam:   VS:  BP 104/64 (BP Location: Left Arm, Patient Position: Sitting, Cuff Size: Normal)   Pulse 87   Resp 16   Ht 5\' 5"  (1.651 m)   Wt 142 lb 9.6 oz (64.7 kg)   SpO2 99%   BMI 23.73 kg/m    Wt Readings from Last 3 Encounters:  02/25/23 142 lb 9.6 oz (64.7 kg)  12/04/22 141 lb (64 kg)  09/03/22 179 lb (81.2 kg)     Neck:     Vascular: No carotid bruit or JVD.  Pulmonary:     Effort: Pulmonary effort is normal.     Breath sounds: Normal breath sounds.  Cardiovascular:  Normal rate. Regular rhythm. Normal heart sounds.     No gallop.   Pulses:    Intact distal pulses.  Edema:    Pretibial: no edema of the left pretibial area and no edema of the right pretibial area. Abdominal:     General: Bowel sounds are normal.     Palpations: Abdomen is soft.      Studies Reviewed: Marland Kitchen   EKG Interpretation Date/Time:  Thursday February 25 2023 08:14:51 EDT Ventricular Rate:  60 PR Interval:  180 QRS Duration:  82 QT Interval:  434 QTC Calculation: 434 R Axis:   32  Text Interpretation: EKG 02/25/2023: Normal sinus rhythm at rate of 60 bpm, normal EKG.  Compared to 06/18/2022 nonspecific T wave abnormality in the leads no longer present. Confirmed by Delrae Rend [phone]) on 02/25/2023 8:24:16 AM    Echocardiogram 06/19/2022:  1. Left ventricular ejection fraction, by estimation, is 60 to 65%. Left ventricular ejection fraction by 2D MOD biplane is 66.6  %. The left ventricle has normal function. The left ventricle has no regional wall motion abnormalities. Left ventricular  diastolic parameters were normal.  2. Right ventricular systolic function is mildly reduced. The right ventricular size is mildly enlarged. Tricuspid regurgitation signal is inadequate for assessing PA pressure.  3. The mitral valve was not well visualized. No evidence of mitral valve regurgitation. No evidence of mitral stenosis.  4. The aortic valve is tricuspid. Aortic valve regurgitation is not visualized. No aortic stenosis is present.  5. The inferior vena cava is normal in size with greater than 50% respiratory variability, suggesting right atrial pressure of 3 mmHg.  Coronary calcium score 01/26/2023 Total Agatston coronary calcium score 1192. MESA database percentile 99. LM: 0  LAD: 542  LCx: 107 RCA: 544 Visualized ascending and descending aorta normal caliber, aortic atherosclerosis with scattered calcification.. Extracardiac abnormalities: Small bilateral noncalcified lung nodules, low risk.   ASSESSMENT AND PLAN: .      ICD-10-CM   1. Elevated coronary artery calcium score 01/26/2023: Total Agatston score 1192, [place]a percentile 99  R93.1 EKG 12-Lead    2. Primary hypertension  I10 EKG 12-Lead    3. Alcoholic cirrhosis of liver without ascites (HCC)  K70.30 EKG 12-Lead      Assessment and Plan 1. Elevated coronary artery calcium score 01/26/2023: Total Agatston score 1192, [place]a percentile 99 Although patient has markedly elevated coronary calcium score, she is completely asymptomatic.  She is getting about 9000-10,000 steps a day without chest pain or dyspnea.  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  She is auto anticoagulated as evidenced by mildly elevated pro time due to liver disease.  I would recommend starting her on atorvastatin 10 mg once a week or  even if possible twice a week with close monitoring of LFTs every 6 weeks to 12 weeks x 2 and if stable, she can be on low-dose atorvastatin for primary/secondary prevention of CAD.  She will discuss with her transplant/hepatology physicians that she has an appointment in the near future in the next couple weeks.  If they do agree, she can MyChart message me and I will start her on the therapy and I will request PCP to follow her closely with regard to LFT management.  - EKG 12-Lead  2. Primary hypertension Blood pressure is very well-controlled on spironolactone 50 mg daily, she is also on Lasix 40 mg daily, as  she has been abstinent, liver function has improved and LFTs have returned back to baseline with marked improvement in recovery and also reviewed her charts from [facility] which also suggest that there has been a significant recovery and hepatic injury, as she does not have any ascites, advised her to change furosemide to as needed use for weight gain of >3 pounds in 3 days.  She can continue with spironolactone at 50 mg and if necessary can increase to 100 mg daily.  - EKG 12-Lead  3. Alcoholic cirrhosis of liver without ascites (HCC) Patient has been completely abstinent, I have reinforced continued abstinence.  She has also changed her diet significantly, she has become almost a completely vegetarian.  I have congratulated her on making lifestyle changes.  As she remains asymptomatic, do not recommend any cardiac stress testing, continued medical therapy is indicated.  She will call us if she develops any chest pain or dyspnea with exertional activity.  She is now trying to achieve >10,000 steps a day. - EKG 12-Lead  Signed,  Yates Decamp, MD, [facility] 02/25/2023, 8:50 AM

## 2023-03-04 ENCOUNTER — Ambulatory Visit
Admission: RE | Admit: 2023-03-04 | Discharge: 2023-03-04 | Disposition: A | Discharge status: Home / Self Care | Payer: No Typology Code available for payment source | Source: Ambulatory Visit | Attending: Obstetrics and Gynecology | Admitting: Obstetrics and Gynecology

## 2023-03-04 DIAGNOSIS — Z1231 Encounter for screening mammogram for malignant neoplasm of breast: Secondary | ICD-10-CM

## 2023-03-16 ENCOUNTER — Other Ambulatory Visit: Payer: Self-pay | Admitting: Nurse Practitioner

## 2023-03-16 DIAGNOSIS — R748 Abnormal levels of other serum enzymes: Secondary | ICD-10-CM

## 2023-03-16 DIAGNOSIS — K7030 Alcoholic cirrhosis of liver without ascites: Secondary | ICD-10-CM

## 2023-04-26 ENCOUNTER — Other Ambulatory Visit: Payer: Self-pay | Admitting: *Deleted

## 2023-04-26 ENCOUNTER — Telehealth: Payer: Self-pay | Admitting: Cardiology

## 2023-04-26 DIAGNOSIS — R931 Abnormal findings on diagnostic imaging of heart and coronary circulation: Secondary | ICD-10-CM

## 2023-04-26 DIAGNOSIS — E785 Hyperlipidemia, unspecified: Secondary | ICD-10-CM

## 2023-04-26 MED ORDER — ATORVASTATIN CALCIUM 10 MG PO TABS: ORAL_TABLET | ORAL | 2 refills | Status: DC

## 2023-04-26 NOTE — Telephone Encounter (Signed)
Pt c/o medication issue:  1. Name of Medication:   Lipitor  2. How are you currently taking this medication (dosage and times per day)?   3. Are you having a reaction (difficulty breathing--STAT)?   4. What is your medication issue?   Patient stated she needed to clear with her liver specialist and they have Ok'd her to take this medication.  Patient stated she wants to get a prescription for this medication sent to [id number] - [place], [place] - [address] AT [address].

## 2023-04-26 NOTE — Telephone Encounter (Signed)
I spoke with patient.  She reports she saw liver doctor in October and has been cleared to start Atorvastatin. Will forward to Dr Jacinto Halim to see if he would like to order this

## 2023-04-26 NOTE — Telephone Encounter (Signed)
Lipitor 10 mg twice weekly. LFT in 2 months. Send 30 tablets with 2 refills please

## 2023-04-26 NOTE — Telephone Encounter (Signed)
Patient notified.  [facility]cription [facility] to [facility] on [address].  Patient aware lab work can be done at [facility] on the first floor of our office building or any [facility]

## 2023-06-07 ENCOUNTER — Ambulatory Visit
Admission: RE | Admit: 2023-06-07 | Discharge: 2023-06-07 | Disposition: A | Discharge status: Home / Self Care | Payer: No Typology Code available for payment source | Source: Ambulatory Visit | Attending: Nurse Practitioner | Admitting: Nurse Practitioner

## 2023-06-07 DIAGNOSIS — K7030 Alcoholic cirrhosis of liver without ascites: Secondary | ICD-10-CM

## 2023-06-07 DIAGNOSIS — R748 Abnormal levels of other serum enzymes: Secondary | ICD-10-CM

## 2023-07-08 LAB — LAB REPORT - SCANNED: EGFR: 84

## 2023-12-10 LAB — LAB REPORT - SCANNED
A1c: 5.3
EGFR: 58

## 2023-12-15 ENCOUNTER — Other Ambulatory Visit: Payer: Self-pay | Admitting: Nurse Practitioner

## 2023-12-15 DIAGNOSIS — D376 Neoplasm of uncertain behavior of liver, gallbladder and bile ducts: Secondary | ICD-10-CM

## 2023-12-15 DIAGNOSIS — F1021 Alcohol dependence, in remission: Secondary | ICD-10-CM

## 2024-01-09 ENCOUNTER — Ambulatory Visit
Admission: RE | Admit: 2024-01-09 | Discharge: 2024-01-09 | Disposition: A | Discharge status: Home / Self Care | Payer: Self-pay | Source: Ambulatory Visit | Attending: Nurse Practitioner

## 2024-01-09 DIAGNOSIS — F1021 Alcohol dependence, in remission: Secondary | ICD-10-CM

## 2024-01-09 DIAGNOSIS — D376 Neoplasm of uncertain behavior of liver, gallbladder and bile ducts: Secondary | ICD-10-CM

## 2024-01-09 MED ORDER — GADOPICLENOL 0.5 MMOL/ML IV SOLN
7.0000 mL | Freq: Once | INTRAVENOUS | Status: AC | PRN
  Administered 2024-01-09: 7 mL via INTRAVENOUS

## 2024-01-10 ENCOUNTER — Other Ambulatory Visit: Payer: Self-pay | Admitting: Cardiology

## 2024-03-20 ENCOUNTER — Encounter (INDEPENDENT_AMBULATORY_CARE_PROVIDER_SITE_OTHER): Payer: Self-pay

## 2024-03-28 ENCOUNTER — Telehealth (INDEPENDENT_AMBULATORY_CARE_PROVIDER_SITE_OTHER): Payer: Self-pay

## 2024-03-28 ENCOUNTER — Encounter (INDEPENDENT_AMBULATORY_CARE_PROVIDER_SITE_OTHER): Payer: Self-pay

## 2024-03-28 NOTE — Telephone Encounter (Signed)
 Weight check

## 2024-03-29 ENCOUNTER — Ambulatory Visit (INDEPENDENT_AMBULATORY_CARE_PROVIDER_SITE_OTHER): Admitting: Nurse Practitioner

## 2024-03-29 ENCOUNTER — Encounter (INDEPENDENT_AMBULATORY_CARE_PROVIDER_SITE_OTHER): Payer: Self-pay | Admitting: Nurse Practitioner

## 2024-03-29 VITALS — BP 126/71 | HR 67 | Temp 97.6°F | Ht 64.0 in | Wt 179.0 lb

## 2024-03-29 DIAGNOSIS — E66811 Obesity, class 1: Secondary | ICD-10-CM

## 2024-03-29 DIAGNOSIS — E782 Mixed hyperlipidemia: Secondary | ICD-10-CM

## 2024-03-29 DIAGNOSIS — Z6830 Body mass index (BMI) 30.0-30.9, adult: Secondary | ICD-10-CM

## 2024-03-29 DIAGNOSIS — Z0289 Encounter for other administrative examinations: Secondary | ICD-10-CM

## 2024-03-29 DIAGNOSIS — K7031 Alcoholic cirrhosis of liver with ascites: Secondary | ICD-10-CM

## 2024-03-29 DIAGNOSIS — I10 Essential (primary) hypertension: Secondary | ICD-10-CM | POA: Diagnosis not present

## 2024-03-29 DIAGNOSIS — K219 Gastro-esophageal reflux disease without esophagitis: Secondary | ICD-10-CM | POA: Diagnosis not present

## 2024-03-29 DIAGNOSIS — K7011 Alcoholic hepatitis with ascites: Secondary | ICD-10-CM

## 2024-03-29 NOTE — Progress Notes (Signed)
 [address] [place], [place], [place] [address] Office: [phone]  /  Fax: [phone]   Initial Consultation    Kim Holland was seen in clinic today to evaluate for obesity. She is interested in losing weight to improve overall health and reduce the risk of weight related complications. She presents today to review program treatment options, initial physical assessment, and evaluation.    Kim Holland first noticed weight gain in her late 20's and has had a gradual increase in weight since that time. She lost 40 pounds 05/2022 with liver cirrhosis and got down to 138 and has regained since 02/2023 to 179 today. Wants to lose back to 130.  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  FibroScan in October 2024 was consistent with cirrhosis and clinically significant portal hypertension with a liver stiffness of 25.3kPa.  Right upper quadrant ultrasound in January 2025 continues to report stable in size 3.4 cm lesion in the right hepatic lobe.  Continues to be alcohol free since 04/2023. She continues to follow with hepatology. Continues on spironolactone 100 mg every day and Lasix 20 mg as needed for ascites control. Last liver functions:  Liver Function Panel 03/16/23 Specimen: Blood - Venous structure (body structure) Component Ref Range & Units 1 yr ago  Protein, Total 6.0 - 8.5 g/dL 6.8  Albumin 3.9 - 4.9 g/dL 4.4  Globulin, Total 1.5 - 4.5 g/dL 2.4  Bilirubin, Total 0.0 - 1.2 mg/dL 0.8  Bilirubin, Direct 0.00 - 0.40 mg/dL 9.69  Bilirubin, Indirect 0.10 - 0.80 mg/dL 9.49  Alkaline Phosphatase 44 - 121 IU/L 180 High   AST (SGOT) 0 - 40 IU/L 27  ALT (SGPT) 0 - 32 IU/L 15    Kim Holland does have hypertension and is currently well controlled with Spironolactone 100 mg every day.  BP Readings from Last 3 Encounters:  03/29/24 126/71  02/25/23 104/64   12/04/22 115/64   She does have GERD controlled currently with Protonix  40 mg every day.  She has hyperlipidemia and is currently on Atorvastatin  40 mg 2 nights a week. 01/26/23 Coronary calcium  score of 1192. This was 99th percentile for age-,race-, and sex-matched controls. Follows with cardiology    Anthropometrics and Bioimpedance Analysis   Body mass index is 30.73 kg/m. Body Fat Mass : 42.6 % Visceral Fat Mass Rating : 11   Obesity Related Diseases and Complications  Obesity Quality of Life and Psychosocial Complications: Body image dissatisfaction and Reduced health-related quality of life  Cardiometabolic: Dyslipidemia or hypercholesterolemia, Hypertension, MASLD or MASH, DOE, and Fatigue  Biomechanical: GERD   Weight Related History  She was referred by: PCP  When asked what they would like to accomplish? She states: Adopt a healthier eating pattern and lifestyle, Improve energy levels and physical activity, Improve existing medical conditions, Improve quality of life, Improve appearance, and Improve self-confidence  Weight history: Late 20's started to notice weight gain and had a gradual increase in weight since that time. She lost 40 pounds 05/2022 with liver cirrhosis and got down to 138 and has regained since 02/2023 to 179 today. Wants to lose back to 130  Highest weight: 187  Contributing factors: family history of obesity, use of obesogenic medications: Psychotropic medications, moderate to high levels of stress, reduced physical activity, chronic skipping of meals, strong orexigenic signaling and/or inadequate inhibitory control , multiple weight loss attempts in the past, and hectic pace of life  Prior weight loss attempts: Weight Watchers- lost 18 pounds, weight stayed off less than a year  Current or previous pharmacotherapy: None  Response to medication: Never tried medications  Current nutrition plan: Vegetarian with minimal protein  Greatest  challenge with dieting: sweet cravings.  Current level of physical activity: None  Barriers to Exercise: time  Readiness and Motivation  On a scale from 0 to 10 How ready are you to make changes to your eating and physical activity to lose weight? 10 How important is it for you to lose weight right now ? 10 How confident are you that you can lose weight if you try? 10  Past Medical History   Past Medical History:  Diagnosis Date   Allergy    Anxiety    GERD (gastroesophageal reflux disease)    Hyperlipidemia    Hypertension    Liver failure (HCC) 06/17/2022   Pneumatouria    Seizures (HCC)    only 1 approximately 3 years ago   Skin tags, anus or rectum    UTI (urinary tract infection)      Objective    BP 126/71   Pulse 67   Temp 97.6 F (36.4 C)   Ht 5' 4 (1.626 m)   Wt 179 lb (81.2 kg)   SpO2 99%   BMI 30.73 kg/m  She was weighed on the bioimpedance scale: Body mass index is 30.73 kg/m.    General:  Alert, oriented and cooperative. Patient is in no acute distress.  Respiratory: Normal respiratory effort, no problems with respiration noted   Gait: able to ambulate independently  Mental Status: Normal mood and affect. Normal behavior. Normal judgment and thought content.   Diagnostic Data Reviewed  BMET    Component Value Date/Time   NA 132 (L) 06/24/2022 0434   K 3.9 06/24/2022 0434   CL 102 06/24/2022 0434   CO2 22 06/24/2022 0434   GLUCOSE 105 (H) 06/24/2022 0434   BUN 17 06/24/2022 0434   CREATININE 0.78 06/24/2022 0434   CALCIUM  7.9 (L) 06/24/2022 0434   GFRNONAA >60 06/24/2022 0434   GFRAA >90 12/16/2013 0652   No results found for: HGBA1C No results found for: INSULIN CBC    Component Value Date/Time   WBC 18.4 (H) 06/24/2022 0434   RBC 3.76 (L) 06/24/2022 0434   HGB 13.0 06/24/2022 0434   HCT 36.5 06/24/2022 0434   PLT 154 06/24/2022 0434   MCV 97.1 06/24/2022 0434   MCH 34.6 (H) 06/24/2022 0434   MCHC 35.6 06/24/2022 0434    RDW 14.5 06/24/2022 0434   Iron/TIBC/Ferritin/ %Sat No results found for: IRON, TIBC, FERRITIN, IRONPCTSAT Lipid Panel  No results found for: CHOL, TRIG, HDL, CHOLHDL, VLDL, LDLCALC, LDLDIRECT Hepatic Function Panel     Component Value Date/Time   PROT 4.7 (L) 06/24/2022 0434   ALBUMIN 1.5 (L) 06/24/2022 0434   AST 148 (H) 06/24/2022 0434   ALT 69 (H) 06/24/2022 0434   ALKPHOS 189 (H) 06/24/2022 0434   BILITOT 6.7 (H) 06/24/2022 0434   BILIDIR 5.7 (H) 06/19/2022 0958   IBILI 4.8 (H) 06/19/2022 0958   No results found for: TSH  Medications  Outpatient Encounter Medications as of 03/29/2024  Medication Sig Note   acetaminophen  (TYLENOL ) 325 MG tablet Take 2 tablets (650 mg total) by mouth every 6 (six) hours as needed.    atorvastatin  (LIPITOR) 10 MG tablet TAKE ONE TABLET BY MOUTH TWICE WEEKLY AT BEDTIME    EPINEPHrine (EPIPEN 2-PAK) 0.3 mg/0.3  mL IJ SOAJ injection EpiPen 2-Pak 0.3 mg/0.3 mL injection, auto-injector  use as directed by prescriber 06/22/2017: Has never used   escitalopram (LEXAPRO) 5 MG tablet Take 10 mg by mouth daily.    folic acid  (FOLVITE ) 1 MG tablet Take 1 mg by mouth daily.    furosemide (LASIX) 20 MG tablet Take 2 tablets by mouth daily as needed for fluid.    hydrOXYzine  (ATARAX /VISTARIL ) 25 MG tablet Take 25 mg by mouth 3 (three) times daily. 06/18/2022: Patient taking  25 mg at bedtime   Melatonin 10 MG TBCR Take 10 capsules by mouth at bedtime.    pantoprazole  (PROTONIX ) 40 MG tablet Take 1 tablet (40 mg total) by mouth daily.    ALPRAZolam  (XANAX ) 1 MG tablet Take 1 tablet by mouth the morning of procedure (Patient not taking: Reported on 03/29/2024)    cyclobenzaprine  (FLEXERIL ) 10 MG tablet Take 1 tablet (10 mg total) by mouth 2 (two) times daily as needed for muscle spasms. (Patient not taking: Reported on 03/29/2024)    spironolactone (ALDACTONE) 50 MG tablet Take 100 mg by mouth daily. (Patient not taking: Reported on 03/29/2024)     No facility-administered encounter medications on file as of 03/29/2024.     Assessment and Plan   Essential hypertension Currently controlled without medication Continue DASH diet Monitor BP and if consistently >140/90 notify PCP If develops headaches, chest pain, shortness of breath or dizziness go to ER Loss of 10-15% body weight can help improve blood pressures   Gastroesophageal reflux disease without esophagitis       Continue Protonix  40 mg every day and limit food triggers, avoid lying down after eating.   Alcoholic cirrhosis of liver with ascites (HCC) Alcoholic hepatitis with ascites (HCC)       Continue to follow with hepatology       Monitor liver enzymes       Loss of 10-15% body weight can improve   Mixed hyperlipidemia Continue Atorvastatin  2 times a week and limit saturated fats Loss of 10-15% body weight can improve lipid levels  Class 1 obesity with serious comorbidity and body mass index (BMI) of 30.0 to 30.9 in adult, unspecified obesity type Obesity Treatment and Action Plan:  Patient will work on garnering support from family and friends to begin weight loss journey. Will work on eliminating or reducing the presence of highly palatable, calorie dense foods in the home. Will complete provided nutritional and psychosocial assessment questionnaire before the next appointment. Will be scheduled for indirect calorimetry to determine resting energy expenditure in a fasting state.  This will allow us  to create a reduced calorie, high-protein meal plan to promote loss of fat mass while preserving muscle mass. Counseled on the health benefits of losing 5%-15% of total body weight. Was counseled on nutritional approaches to weight loss and benefits of reducing processed foods and consuming plant-based foods and high quality protein as part of nutritional weight management. Was counseled on pharmacotherapy and role as an adjunct in weight management.   Education and  Additional resources  She was weighed on the bioimpedance scale and results were discussed and documented in the synopsis.  We discussed obesity as a progressive, chronic disease and the importance of a more detailed evaluation of all the factors contributing to the disease.  We reviewed the basic principles in obesity management.   We discussed the importance of long term lifestyle changes which include nutrition, exercise and behavioral modification as well as the importance of customizing  this to her specific health and social needs.  We reviewed the role of medical interventions including pharmacotherapy and surgical interventions.   We discussed the benefits of reaching a healthier weight to alleviate the symptoms of existing conditions and reduce the risks of the biomechanical, cardiometabolic and psychological effects of obesity.  We reviewed our program approach and philosophy, which are guided by the four pillars of obesity medicine.  We discussed how to prepare for intake appointment and the importance of fasting and avoidance of stimulants for at least 8 hours prior to indirect calorimetry.  Kim Holland appears to be in the action stage of change and reports being ready to initiate intensive lifestyle and behavioral modifications as part of their weight loss journey.  Attestation  Reviewed by clinician on day of visit: allergies, medications, problem list, medical history, surgical history, family history, social history, and previous encounter notes pertinent to obesity diagnosis.  I personally spent a total of 35 minutes in the care of the patient today including preparing to see the patient, getting/reviewing separately obtained history, performing a medically appropriate exam/evaluation, counseling and educating, and documenting clinical information in the EHR.   Oryan Winterton ANP-C

## 2024-04-12 ENCOUNTER — Ambulatory Visit (INDEPENDENT_AMBULATORY_CARE_PROVIDER_SITE_OTHER): Admitting: Nurse Practitioner

## 2024-04-19 ENCOUNTER — Encounter (INDEPENDENT_AMBULATORY_CARE_PROVIDER_SITE_OTHER): Payer: Self-pay

## 2024-04-26 ENCOUNTER — Ambulatory Visit (INDEPENDENT_AMBULATORY_CARE_PROVIDER_SITE_OTHER): Admitting: Nurse Practitioner

## 2024-05-02 ENCOUNTER — Encounter (INDEPENDENT_AMBULATORY_CARE_PROVIDER_SITE_OTHER): Payer: Self-pay | Admitting: Nurse Practitioner

## 2024-05-02 ENCOUNTER — Ambulatory Visit (INDEPENDENT_AMBULATORY_CARE_PROVIDER_SITE_OTHER): Admitting: Nurse Practitioner

## 2024-05-02 VITALS — BP 128/79 | HR 61 | Temp 97.9°F | Ht 64.0 in | Wt 181.0 lb

## 2024-05-02 DIAGNOSIS — I10 Essential (primary) hypertension: Secondary | ICD-10-CM

## 2024-05-02 DIAGNOSIS — Z6831 Body mass index (BMI) 31.0-31.9, adult: Secondary | ICD-10-CM

## 2024-05-02 DIAGNOSIS — E559 Vitamin D deficiency, unspecified: Secondary | ICD-10-CM

## 2024-05-02 DIAGNOSIS — Z1331 Encounter for screening for depression: Secondary | ICD-10-CM

## 2024-05-02 DIAGNOSIS — R0602 Shortness of breath: Secondary | ICD-10-CM

## 2024-05-02 DIAGNOSIS — R5383 Other fatigue: Secondary | ICD-10-CM

## 2024-05-02 DIAGNOSIS — K7011 Alcoholic hepatitis with ascites: Secondary | ICD-10-CM

## 2024-05-02 DIAGNOSIS — K7031 Alcoholic cirrhosis of liver with ascites: Secondary | ICD-10-CM

## 2024-05-02 DIAGNOSIS — K219 Gastro-esophageal reflux disease without esophagitis: Secondary | ICD-10-CM

## 2024-05-02 DIAGNOSIS — E782 Mixed hyperlipidemia: Secondary | ICD-10-CM

## 2024-05-02 NOTE — Progress Notes (Signed)
 [address] [place],  [place], [place] [address]  Office: [phone]  /  Fax: [phone]   Subjective   Initial Visit  Kim Holland (MR# [id number]) is a 63 y.o. female who presents for evaluation and treatment of obesity and related comorbidities. Current BMI is Body mass index is 31.07 kg/m. Kim Holland has been struggling with her weight for many years and has been unsuccessful in either losing weight, maintaining weight loss, or reaching her healthy weight goal.  Kim Holland is currently in the action stage of change and ready to dedicate time achieving and maintaining a healthier weight. Kim Holland is interested in becoming our patient and working on intensive lifestyle modifications including (but not limited to) diet and exercise for weight loss.  Weight history: Kim Holland first noticed weight gain in her late 20's and she has had a gradual increase in weight since that time. She lost 40 pounds 05/2022 with liver cirrhosis and got down to 138 and has regained since 02/2023 to 181 today. Wants to lose back to 130.   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  FibroScan in October 2024 was consistent with cirrhosis and clinically significant portal hypertension with a liver stiffness of 25.3kPa.  Right upper quadrant ultrasound in January 2025 continues to report stable in size 3.4 cm lesion in the right hepatic lobe.  Continues to be alcohol free since 04/2023. She continues to follow with hepatology. Last liver functions:  Liver Function Panel 03/16/23 Specimen: Blood - Venous structure (body structure) Component Ref Range & Units 1 yr ago  Protein, Total 6.0 - 8.5 g/dL 6.8  Albumin 3.9 - 4.9 g/dL 4.4  Globulin, Total 1.5 - 4.5 g/dL 2.4  Bilirubin, Total 0.0 - 1.2 mg/dL 0.8  Bilirubin, Direct 0.00 - 0.40 mg/dL 9.69  Bilirubin, Indirect 0.10 - 0.80 mg/dL  9.49  Alkaline Phosphatase 44 - 121 IU/L 180 High   AST (SGOT) 0 - 40 IU/L 27  ALT (SGPT) 0 - 32 IU/L 15    Kim Holland does have hypertension which is currently well controlled without medication. Denies headaches, chest pain, shortness of breath at rest and dizziness.  BP Readings from Last 3 Encounters:  05/02/24 128/79  03/29/24 126/71  02/25/23 104/64   She does have hyperlipidemia and is currently on Atorvastatin  10 mg twice a week at bedtime She also take Protonix  40 mg every day for GERD and symptoms are controlled currently. She has a history of Vit D deficiency but is currently on no supplementation.     When asked how their weight has affected their life and health, she states: Has affected self-esteem, Contributed to medical problems, Having fatigue, and Having poor endurance  When asked what else they would like to accomplish? She states: Adopt a healthier eating pattern and lifestyle, Improve energy levels and physical activity, Improve existing medical conditions, Improve quality of life, Improve appearance, and Improve self-confidence  She starting to note weight gain during : adulthood.In her ate 20's she started to notice weight gain and had a gradual increase in weight since that time. She lost 40 pounds 05/2022 with liver cirrhosis and got down to 138 and has regained since 02/2023 to 181 today. Wants to lose back to 130   Life events associated with weight gain include : Late 20's she started gaining weight and has gradually occurred since that time.   Other contributing factors: family history of obesity, use  of obesogenic medications: Psychotropic medications, moderate to high levels of stress, reduced physical activity, chronic skipping of meals, strong orexigenic signaling and/or inadequate inhibitory control , slow metabolism for age, multiple weight loss attempts in the past, and hectic pace of life.  Their highest weight has been:  187 lbs.  Desired weight:  125  Previous weight-loss programs : Weight Watchers.  Their maximum weight loss was:  18 lbs.- kept weight off almost a year  Their greatest challenge with dieting: sweets cravings.  Current or previous pharmacotherapy: None.  Response to medication: Never tried medications   Nutritional History:  Current nutrition plan: Vegetarian. Minimal protein  How many times do you eat outside the home: 2-4 per week  How often do they skip meals: skips breakfast and skips lunch  What beverages do they drink: water, caffeinated beverages , juice, protein shakes , and unsweetened tea.   Use of artificial sweetners : Yes  Food intolerances or dislikes: liver.  Food triggers: Stress, Boredom, Seeking reward, and When Sad.  Food cravings: Sugary  Do they struggle with excessive hunger or portion control : No    Physical Activity:  Current level of physical activity: Walking 20 minutes, once every 2 weeks.  She is having a lot of stress with her mother who has Alzheimers  Barriers to Exercise: time   Past medical history includes:   Past Medical History:  Diagnosis Date   Alcohol abuse    Allergy    Anxiety    Back pain    Edema of both lower extremities    Fatty liver    Female infertility    GERD (gastroesophageal reflux disease)    Hyperlipidemia    Hypertension    Joint pain    Liver failure (HCC) 06/17/2022   Palpitations    Pneumatouria    Seizures (HCC)    only 1 approximately 3 years ago   Skin tags, anus or rectum    UTI (urinary tract infection)      Objective   BP 128/79   Pulse 61   Temp 97.9 F (36.6 C)   Ht 5' 4 (1.626 m)   Wt 181 lb (82.1 kg)   SpO2 98%   BMI 31.07 kg/m  She was weighed on the bioimpedance scale: Body mass index is 31.07 kg/m.    Anthropometrics:  Vitals Temp: 97.9 F (36.6 C) BP: 128/79 Pulse Rate: 61 SpO2: 98 %   Anthropometric Measurements Height: 5' 4 (1.626 m) Weight: 181 lb (82.1 kg) BMI (Calculated):  31.05 Starting Weight: 181 lb Peak Weight: 186 lb Waist Measurement : 40 inches   Body Composition  Body Fat %: 43.8 % Fat Mass (lbs): 79.4 lbs Muscle Mass (lbs): 96.8 lbs Total Body Water (lbs): 70 lbs Visceral Fat Rating : 12   Other Clinical Data RMR: 1498 Fasting: Yes Labs: Yes Today's Visit #: 1 Starting Date: 05/02/24    Physical Exam:  General: She is overweight, cooperative, alert, well developed, and in no acute distress. PSYCH: Has normal mood, affect and thought process.   HEENT: EOMI, sclerae are anicteric. Lungs: Normal breathing effort, no conversational dyspnea. Extremities: No edema.  Neurologic: No gross sensory or motor deficits. No tremors or fasciculations noted.    Diagnostic Data Reviewed  EKG: Sinus bradycardia, rate 58. No changes compared to 02/25/23 EKG are noted  Indirect Calorimeter completed today shows a VO2 of 217 and a REE of 1498.  Her calculated basal metabolic rate is 8577 thus her resting  energy expenditure same as calculated.  Depression Screen  Kamri's PHQ-9 score was: 2. No need for further intervention, she is currently monitored by PCP and taking Lexapro 10 mg every day, Hydroxyzine  25 mg TID for anxiety and symptoms are well controlled     05/02/2024    9:42 AM  Depression screen PHQ 2/9  Decreased Interest 0  Down, Depressed, Hopeless 0  PHQ - 2 Score 0  Altered sleeping 0  Tired, decreased energy 1  Change in appetite 1  Feeling bad or failure about yourself  0  Trouble concentrating 0  Moving slowly or fidgety/restless 0  Suicidal thoughts 0  PHQ-9 Score 2  Difficult doing work/chores Not difficult at all    Screening for Sleep Related Breathing Disorders  Sotiria denies daytime somnolence and denies waking up still tired. Patient has a history of symptoms of hypertension. Yarnell generally gets 8 hours of sleep per night, and states that she has generally restful sleep. Snoring is present. Apneic episodes are not  present. Epworth Sleepiness Score is 3.   BMET    Component Value Date/Time   NA 132 (L) 06/24/2022 0434   K 3.9 06/24/2022 0434   CL 102 06/24/2022 0434   CO2 22 06/24/2022 0434   GLUCOSE 105 (H) 06/24/2022 0434   BUN 17 06/24/2022 0434   CREATININE 0.78 06/24/2022 0434   CALCIUM  7.9 (L) 06/24/2022 0434   GFRNONAA >60 06/24/2022 0434   GFRAA >90 12/16/2013 0652   No results found for: HGBA1C No results found for: INSULIN CBC    Component Value Date/Time   WBC 18.4 (H) 06/24/2022 0434   RBC 3.76 (L) 06/24/2022 0434   HGB 13.0 06/24/2022 0434   HCT 36.5 06/24/2022 0434   PLT 154 06/24/2022 0434   MCV 97.1 06/24/2022 0434   MCH 34.6 (H) 06/24/2022 0434   MCHC 35.6 06/24/2022 0434   RDW 14.5 06/24/2022 0434   Iron/TIBC/Ferritin/ %Sat No results found for: IRON, TIBC, FERRITIN, IRONPCTSAT Lipid Panel  No results found for: CHOL, TRIG, HDL, CHOLHDL, VLDL, LDLCALC, LDLDIRECT Hepatic Function Panel     Component Value Date/Time   PROT 4.7 (L) 06/24/2022 0434   ALBUMIN 1.5 (L) 06/24/2022 0434   AST 148 (H) 06/24/2022 0434   ALT 69 (H) 06/24/2022 0434   ALKPHOS 189 (H) 06/24/2022 0434   BILITOT 6.7 (H) 06/24/2022 0434   BILIDIR 5.7 (H) 06/19/2022 0958   IBILI 4.8 (H) 06/19/2022 0958   No results found for: TSH   Assessment and Plan   TREATMENT PLAN FOR OBESITY:  Recommended Dietary Goals  Dezirea is currently in the action stage of change. As such, her goal is to implement medically supervised obesity management plan.  She has agreed to implement: protein rich, plant-based plan   Behavioral Intervention  We discussed the following Behavioral Modification Strategies today: increasing lean protein intake to established goals, decreasing simple carbohydrates , increasing vegetables, increasing lower glycemic fruits, increasing fiber rich foods, avoiding skipping meals, increasing water intake, work on meal planning and preparation, reading  food labels , keeping healthy foods at home, identifying sources and decreasing liquid calories, decreasing eating out or consumption of processed foods, and making healthy choices when eating convenient foods, planning for success, and better snacking choices  Additional resources provided today: Handout on healthy eating and balanced plate, Handout on complex carbohydrates and lean sources of protein, Category vegetarian/pescetarinan packet, and Handout principles of weight management  She plans to maintain her weight in December -  She wants to have small indulgences, but she is not going to waste her calories on things that are not wonderful. - Outside of social situations she will continue to follow a structured eating plan but enjoy herself with portion control for holiday events, parties and get-togethers - She does recognize that this strategy is to help her maintain her weight, not lose weight and in January she will return to a structured plan for weight loss   Recommended Physical Activity Goals  Llewellyn has been advised to work up to 150 minutes of moderate intensity aerobic activity a week and strengthening exercises 2-3 times per week for cardiovascular health, weight loss maintenance and preservation of muscle mass.   She has agreed to :  Think about enjoyable ways to increase daily physical activity and overcoming barriers to exercise, Increase physical activity in their day and reduce sedentary time (increase NEAT)., and Start aerobic activity with a goal of 150 minutes a week at moderate intensity.    Medical Interventions and Pharmacotherapy We will work on building a therapist, art and behavioral strategies. We will discuss the role of pharmacotherapy as an adjunct at subsequent visits.   ASSOCIATED CONDITIONS ADDRESSED TODAY  Other Fatigue  Berdia does feel that her weight is causing her energy to be lower than it should be. Fatigue may be related to  obesity, depression or many other causes. Labs will be ordered, and in the meanwhile, Freddie will focus on self care including making healthy food choices, increasing physical activity and focusing on stress reduction. - EKG 12 lead  Shortness of Breath on exertion Cheyenna notes increasing shortness of breath with physical activity and seems to be worsening over time with weight gain. She notes getting out of breath sooner with activity than she used to. This has not gotten worse recently. Bayleigh denies shortness of breath at rest or orthopnea.  Depression screen       Scored a 2 on PHQ9- no further intervention is currently needed.   Essential hypertension Currently controlled without medication Start vegetarian/pescatarian meal plan and DASH diet Monitor BP and if consistently >140/90 notify PCP If develops headaches, chest pain, shortness of breath or dizziness go to ER Continue to follow regularly with PCP -     CBC with Differential/Platelet -     Comprehensive metabolic panel with GFR  Mixed hyperlipidemia Continue Atorvastatin  10 mg twice a week at bedtime Focus on implementing vegetarian/pescatarian meal plan, limit saturated fats, increasing fiber/water/lean protein Loss of 10-15% body weight can improve lipid levels Focus on getting 150 minutes a week of moderate to high intensity exercise  -     Lipid Panel With LDL/HDL Ratio  Class 1 obesity with serious comorbidity and body mass index (BMI) of 31.0 to 31.9 in adult, unspecified obesity type See plan above -     Vitamin B12 -     CBC with Differential/Platelet -     Comprehensive metabolic panel with GFR -     Hemoglobin A1c -     Insulin, random -     TSH -     VITAMIN D 25 Hydroxy (Vit-D Deficiency, Fractures) -     Lipid Panel With LDL/HDL Ratio  Vitamin D deficiency If Vit D level remains low will supplement with Ergocalciferol 50000 units once a week for 12 weeks and then recheck level.   -     VITAMIN D 25  Hydroxy (Vit-D Deficiency, Fractures)  Alcoholic cirrhosis of liver with ascites (HCC)  Alcoholic hepatitis with ascites (HCC)       Continue to follow with GI       Focus on pescatarian/vegetarian meal plan and increase lean protein, fiber and water while decreasing simple  carbohydrates. Continue to walk 320-30 minutes 2-3 times a week for now and will plan to increase.        Follow-up  She was informed of the importance of frequent follow-up visits to maximize her success with intensive lifestyle modifications for her multiple health conditions. She was informed we would discuss her lab results at her next visit unless there is a critical issue that needs to be addressed sooner. Carmyn agreed to keep her next visit at the agreed upon time to discuss these results.  Attestation Statement  This is the patient's intake visit at [facility]. The patient's Health Questionnaire was reviewed at length. Included in the packet: current and past health history, medications, allergies, ROS, gynecologic history (women only), surgical history, family history, social history, weight history, weight loss surgery history (for those that have had weight loss surgery), nutritional evaluation, mood and food questionnaire, PHQ9, Epworth questionnaire, sleep habits questionnaire, patient life and health improvement goals questionnaire. These will all be scanned into the patient's chart under media.   During the visit, I independently reviewed the patient's EKG, previous labs, bioimpedance scale results, and indirect calorimetry results. I used this information to medically tailor a meal plan for the patient that will help her to lose weight and will improve her obesity-related conditions. I performed a medically necessary appropriate examination and/or evaluation. I discussed the assessment and treatment plan with the patient. The patient was provided an opportunity to ask questions and all were  answered. The patient agreed with the plan and demonstrated an understanding of the instructions. Labs were ordered at this visit and will be reviewed at the next visit unless critical results need to be addressed immediately. Clinical information was updated and documented in the EMR.   In addition, they received basic education on identification of processed foods and reduction of these, different sources of lean proteins and complex carbohydrates and how to eat balanced by incorporation of whole foods.  Reviewed by clinician on day of visit: allergies, medications, problem list, medical history, surgical history, family history, social history, and previous encounter notes.  I personally spent a total of 79 minutes in the care of the patient today including preparing to see the patient, getting/reviewing separately obtained history, performing a medically appropriate exam/evaluation, counseling and educating, placing orders, and documenting clinical information in the EHR. Does not include time spent for indirect calorimetry, EKG or depression screening   Lonell Liverpool ANP-C

## 2024-05-03 ENCOUNTER — Ambulatory Visit (INDEPENDENT_AMBULATORY_CARE_PROVIDER_SITE_OTHER): Payer: Self-pay | Admitting: Nurse Practitioner

## 2024-05-03 LAB — CBC WITH DIFFERENTIAL/PLATELET
Basophils Absolute: 0.1 x10E3/uL (ref 0.0–0.2)
Basos: 1 %
EOS (ABSOLUTE): 0.2 x10E3/uL (ref 0.0–0.4)
Eos: 3 %
Hematocrit: 45.8 % (ref 34.0–46.6)
Hemoglobin: 16 g/dL — ABNORMAL HIGH (ref 11.1–15.9)
Immature Grans (Abs): 0 x10E3/uL (ref 0.0–0.1)
Immature Granulocytes: 0 %
Lymphocytes Absolute: 2.1 x10E3/uL (ref 0.7–3.1)
Lymphs: 35 %
MCH: 30.2 pg (ref 26.6–33.0)
MCHC: 34.9 g/dL (ref 31.5–35.7)
MCV: 87 fL (ref 79–97)
Monocytes Absolute: 0.5 x10E3/uL (ref 0.1–0.9)
Monocytes: 8 %
Neutrophils Absolute: 3.1 x10E3/uL (ref 1.4–7.0)
Neutrophils: 53 %
Platelets: 204 x10E3/uL (ref 150–450)
RBC: 5.29 x10E6/uL — ABNORMAL HIGH (ref 3.77–5.28)
RDW: 12.7 % (ref 11.7–15.4)
WBC: 6 x10E3/uL (ref 3.4–10.8)

## 2024-05-03 LAB — COMPREHENSIVE METABOLIC PANEL WITH GFR
ALT: 17 IU/L (ref 0–32)
AST: 25 IU/L (ref 0–40)
Albumin: 4.3 g/dL (ref 3.9–4.9)
Alkaline Phosphatase: 165 IU/L — ABNORMAL HIGH (ref 49–135)
BUN/Creatinine Ratio: 10 — ABNORMAL LOW (ref 12–28)
BUN: 10 mg/dL (ref 8–27)
Bilirubin Total: 0.9 mg/dL (ref 0.0–1.2)
CO2: 23 mmol/L (ref 20–29)
Calcium: 9.4 mg/dL (ref 8.7–10.3)
Chloride: 102 mmol/L (ref 96–106)
Creatinine, Ser: 1 mg/dL (ref 0.57–1.00)
Globulin, Total: 2.6 g/dL (ref 1.5–4.5)
Glucose: 86 mg/dL (ref 70–99)
Potassium: 4.5 mmol/L (ref 3.5–5.2)
Sodium: 139 mmol/L (ref 134–144)
Total Protein: 6.9 g/dL (ref 6.0–8.5)
eGFR: 64 mL/min/1.73 (ref >59)

## 2024-05-03 LAB — HEMOGLOBIN A1C
Est. average glucose Bld gHb Est-mCnc: 108 mg/dL
Hgb A1c MFr Bld: 5.4 % (ref 4.8–5.6)

## 2024-05-03 LAB — LIPID PANEL WITH LDL/HDL RATIO
Cholesterol, Total: 193 mg/dL (ref 100–199)
HDL: 58 mg/dL (ref >39)
LDL Chol Calc (NIH): 114 mg/dL — ABNORMAL HIGH (ref 0–99)
LDL/HDL Ratio: 2 ratio (ref 0.0–3.2)
Triglycerides: 121 mg/dL (ref 0–149)
VLDL Cholesterol Cal: 21 mg/dL (ref 5–40)

## 2024-05-03 LAB — VITAMIN B12: Vitamin B-12: 1014 pg/mL (ref 232–1245)

## 2024-05-03 LAB — INSULIN, RANDOM: INSULIN: 18.6 u[IU]/mL (ref 2.6–24.9)

## 2024-05-03 LAB — VITAMIN D 25 HYDROXY (VIT D DEFICIENCY, FRACTURES): Vit D, 25-Hydroxy: 24.2 ng/mL — ABNORMAL LOW (ref 30.0–100.0)

## 2024-05-03 LAB — TSH: TSH: 1.93 u[IU]/mL (ref 0.450–4.500)

## 2024-05-16 ENCOUNTER — Encounter (INDEPENDENT_AMBULATORY_CARE_PROVIDER_SITE_OTHER): Payer: Self-pay | Admitting: Nurse Practitioner

## 2024-05-16 ENCOUNTER — Ambulatory Visit (INDEPENDENT_AMBULATORY_CARE_PROVIDER_SITE_OTHER): Admitting: Nurse Practitioner

## 2024-05-16 VITALS — BP 114/75 | HR 61 | Temp 98.0°F | Ht 64.0 in | Wt 175.0 lb

## 2024-05-16 DIAGNOSIS — I10 Essential (primary) hypertension: Secondary | ICD-10-CM

## 2024-05-16 DIAGNOSIS — Z6830 Body mass index (BMI) 30.0-30.9, adult: Secondary | ICD-10-CM | POA: Diagnosis not present

## 2024-05-16 DIAGNOSIS — E559 Vitamin D deficiency, unspecified: Secondary | ICD-10-CM

## 2024-05-16 DIAGNOSIS — K7011 Alcoholic hepatitis with ascites: Secondary | ICD-10-CM

## 2024-05-16 DIAGNOSIS — E66811 Obesity, class 1: Secondary | ICD-10-CM | POA: Diagnosis not present

## 2024-05-16 DIAGNOSIS — E782 Mixed hyperlipidemia: Secondary | ICD-10-CM

## 2024-05-16 DIAGNOSIS — K7031 Alcoholic cirrhosis of liver with ascites: Secondary | ICD-10-CM

## 2024-05-16 MED ORDER — VITAMIN D (ERGOCALCIFEROL) 1.25 MG (50000 UNIT) PO CAPS: 50000.0000 [IU] | ORAL_CAPSULE | ORAL | 1 refills | Status: DC

## 2024-05-16 NOTE — Progress Notes (Signed)
 " Office: [phone]  /  Fax: [phone]  WEIGHT SUMMARY AND BIOMETRICS  Weight Lost Since Last Visit: 6 lb  Weight Gained Since Last Visit: 0   Vitals Temp: 98 F (36.7 C) BP: 114/75 Pulse Rate: 61 SpO2: 98 %   Anthropometric Measurements Height: 5' 4 (1.626 m) Weight: 175 lb (79.4 kg) BMI (Calculated): 30.02 Weight at Last Visit: 181 lb Weight Lost Since Last Visit: 6 lb Weight Gained Since Last Visit: 0 Starting Weight: 181 lb Total Weight Loss (lbs): 6 lb (2.722 kg) Peak Weight: 186 lb Waist Measurement : 40 inches   Body Composition  Body Fat %: 43 % Fat Mass (lbs): 75.2 lbs Muscle Mass (lbs): 94.8 lbs Total Body Water (lbs): 68.6 lbs Visceral Fat Rating : 11   Other Clinical Data Fasting: no Labs: no Today's Visit #: 2 Starting Date: 05/02/24    Total Weight Loss: 6 pounds Percent of body weight lost: 3.3%  Bio Impedance Data reviewed with patient: Muscle is down 2 pounds, adipose is down 4.2 pounds.  Visceral fat rating decreased by 1 point from 12 to 11.  HPI  Chief Complaint: OBESITY  Kim Holland is here to discuss her progress with her obesity treatment plan. She is on the the Pescatarian Plan and the Vegetarian Plan and states she is following her eating plan approximately 90 % of the time. She states she has not started exercising   Interval History:  Since last office visit she has been doing a combination of pescatarian/vegetarian meal plan about 90% of the time.  Most days getting between 80-100 grams of protein. She is using cottage cheese, occasional chicken and greek yogurt. She has been doing well on this plan. She has not yet started exercising- does have step goal of 89999 steps but is getting between 6000-9000. She has a goal to walk her doge 4 days a week. She is getting about 64 ounces of water daily. She has decreased her amount of fruit as she used to eat a lot of fruit- still has 1-2 apples and blueberries.  She continues to eat  some after dinner- was doing cashews and almonds. Or apple and string cheese.   Kim Holland has a history of hypertension which is currently controlled without medication BP Readings from Last 3 Encounters:  05/16/24 114/75  05/02/24 128/79  03/29/24 126/71   She continues to follow regularly with hepatology for alcoholic cirrhosis and has been stable. No alcohol intake. She continues on Atorvastatin  10 mg 2 nights a week for hyperlipidemia and denies side effects with this regimen.   Lab results are reviewed with patient in detail. Elevated alkaline phosphatase but has history of alcoholic cirrhosis and is followed by hepatology. Stage 2 CKD - GFR 64.  Normal electrolytes, fasting glucose, Vit B12, A1c, and thyroid . 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 Vit D is 24.2 which is deficient and would benefit from supplementation. She does have elevated LDL cholesterol on lipid panel. CBC shows mildly elevated RBC and hemoglobin.  Cancer screenings are reviewed with patient as obesity is a risk cancer for certain cancers.   Pap: 08/26/22- negative Mammogram: 03/04/23 Colonoscopy: 09/03/22 due 2034  ASCVD risk is reviewed with patient as obesity is a risk factor for cardiovascular disease  The 10-year ASCVD risk score (Arnett DK, et al., 2019) is: 3.8%- low risk   Values used to calculate the score:     Age: 63 years     Clinically relevant  sex: Female     Is Non-Hispanic African American: No     Diabetic: No     Tobacco smoker: No     Systolic Blood Pressure: 128 mmHg     Is BP treated: No     HDL Cholesterol: 58 mg/dL     Total Cholesterol: 193 mg/dL    PHYSICAL EXAM:  Blood pressure 114/75, pulse 61, temperature 98 F (36.7 C), height 5' 4 (1.626 m), weight 175 lb (79.4 kg), SpO2 98%. Body mass index is 30.04 kg/m.  General: Well Developed, well nourished, and in no acute distress.  HEENT: Normocephalic, atraumatic; EOMI, sclerae are  anicteric. Skin: Warm and dry, good turgor Chest:  Normal excursion, shape, no gross ABN Respiratory: No conversational dyspnea; speaking in full sentences NeuroM-Sk:  Normal gross ROM * 4 extremities  Psych: A and O X 3, insight adequate, mood- full    DIAGNOSTIC DATA REVIEWED:  BMET    Component Value Date/Time   NA 139 05/02/2024 1125   K 4.5 05/02/2024 1125   CL 102 05/02/2024 1125   CO2 23 05/02/2024 1125   GLUCOSE 86 05/02/2024 1125   GLUCOSE 105 (H) 06/24/2022 0434   BUN 10 05/02/2024 1125   CREATININE 1.00 05/02/2024 1125   CALCIUM  9.4 05/02/2024 1125   GFRNONAA >60 06/24/2022 0434   GFRAA >90 12/16/2013 0652   Lab Results  Component Value Date   HGBA1C 5.4 05/02/2024   Lab Results  Component Value Date   INSULIN  18.6 05/02/2024   Lab Results  Component Value Date   TSH 1.930 05/02/2024   CBC    Component Value Date/Time   WBC 6.0 05/02/2024 1125   WBC 18.4 (H) 06/24/2022 0434   RBC 5.29 (H) 05/02/2024 1125   RBC 3.76 (L) 06/24/2022 0434   HGB 16.0 (H) 05/02/2024 1125   HCT 45.8 05/02/2024 1125   PLT 204 05/02/2024 1125   MCV 87 05/02/2024 1125   MCH 30.2 05/02/2024 1125   MCH 34.6 (H) 06/24/2022 0434   MCHC 34.9 05/02/2024 1125   MCHC 35.6 06/24/2022 0434   RDW 12.7 05/02/2024 1125   Iron Studies No results found for: IRON, TIBC, FERRITIN, IRONPCTSAT Lipid Panel     Component Value Date/Time   CHOL 193 05/02/2024 1125   TRIG 121 05/02/2024 1125   HDL 58 05/02/2024 1125   LDLCALC 114 (H) 05/02/2024 1125   Hepatic Function Panel     Component Value Date/Time   PROT 6.9 05/02/2024 1125   ALBUMIN 4.3 05/02/2024 1125   AST 25 05/02/2024 1125   ALT 17 05/02/2024 1125   ALKPHOS 165 (H) 05/02/2024 1125   BILITOT 0.9 05/02/2024 1125   BILIDIR 5.7 (H) 06/19/2022 0958   IBILI 4.8 (H) 06/19/2022 0958      Component Value Date/Time   TSH 1.930 05/02/2024 1125   Nutritional Lab Results  Component Value Date   VD25OH 24.2 (L)  05/02/2024     ASSESSMENT AND PLAN  Class 1 obesity with serious comorbidity and body mass index (BMI) of 30.0 to 30.9 in adult, unspecified obesity type TREATMENT PLAN FOR OBESITY:  Recommended Dietary Goals  Taheera is currently in the action stage of change. As such, her goal is to continue weight management plan. She has agreed to the Bl[place]inx and the Vegetarian Plan.  Behavioral Intervention  We discussed the following Behavioral Modification Strategies today: increasing lean protein intake to established goals, increasing vegetables, increasing water intake , work on meal planning and  preparation, celebration eating strategies, continue to work on maintaining a reduced calorie state, getting the recommended amount of protein, incorporating whole foods, making healthy choices, staying well hydrated and practicing mindfulness when eating., and increase protein intake, fibrous foods (25 grams per day for women, 30 grams for men) and water to improve satiety and decrease hunger signals. .  She wants to lose weight in December - She does  recognize that she must follow a structured plan and will eat before social situation to meet her protein goals and minimze party foods - She will give away food gifts unless they are very low calories or high protein - She will avoid calorie-containing liquids such as holiday drinks, eggnog and alcohol -She will stick to her structured plan strictly most of the time - This strategy should help her lose 1-2 pounds in December   Recommended Physical Activity Goals  Kim Holland has been advised to work up to 150 minutes of moderate intensity aerobic activity a week and strengthening exercises 2-3 times per week for cardiovascular health, weight loss maintenance and preservation of muscle mass.   She has agreed to Patient will begin to exercise with walking dog 4 days a week, Think about enjoyable ways to increase daily physical activity and overcoming  barriers to exercise, and Increase physical activity in their day and reduce sedentary time (increase NEAT).   Pharmacotherapy We discussed various medication options to help Kim Holland with her weight loss efforts and we both agreed to start Ergocalciferol  50000 units once a week for Vit D deficiency.  ASSOCIATED CONDITIONS ADDRESSED TODAY  Action/Plan  Alcoholic hepatitis with ascites (HCC) Alcoholic cirrhosis of liver with ascites (HCC)       Continue Pescatarian/vegetarian meal plan and limit simple carbs and saturated fats       Continue following with hepatology regularly  Mixed hyperlipidemia Focus on implementing Pescatarian/vegetarian meal plan, limit saturated fats and simple carbs. Increase fiber, lean protein and water Continue Atorvastatin  10 mg twice a week at bedtime Focus on starting exercise with walking dog 4 days a week  Essential hypertension Currently controlled without medication Continue Pescatarian/vegetarian meal plan  and DASH diet Monitor BP and if consistently >140/90 notify PCP If develops headaches, chest pain, shortness of breath or dizziness go to ER  Vitamin D  deficiency Supplement with Ergocalciferol  50000 units once a week  -     Vitamin D  (Ergocalciferol ); Take 1 capsule (50,000 Units total) by mouth every 7 (seven) days.  Dispense: 5 capsule; Refill: 1       Return in about 4 weeks (around 06/13/2024).Kim Holland She was informed of the importance of frequent follow up visits to maximize her success with intensive lifestyle modifications for her multiple health conditions.   ATTESTASTION STATEMENTS:  Reviewed by clinician on day of visit: allergies, medications, problem list, medical history, surgical history, family history, social history, and previous encounter notes.    Lonell Liverpool ANP-C "

## 2024-06-15 ENCOUNTER — Encounter (INDEPENDENT_AMBULATORY_CARE_PROVIDER_SITE_OTHER): Payer: Self-pay | Admitting: Nurse Practitioner

## 2024-06-15 ENCOUNTER — Ambulatory Visit (INDEPENDENT_AMBULATORY_CARE_PROVIDER_SITE_OTHER): Admitting: Nurse Practitioner

## 2024-06-15 VITALS — BP 111/71 | HR 56 | Temp 98.1°F | Ht 64.0 in | Wt 173.0 lb

## 2024-06-15 DIAGNOSIS — E782 Mixed hyperlipidemia: Secondary | ICD-10-CM

## 2024-06-15 DIAGNOSIS — I10 Essential (primary) hypertension: Secondary | ICD-10-CM | POA: Diagnosis not present

## 2024-06-15 DIAGNOSIS — E559 Vitamin D deficiency, unspecified: Secondary | ICD-10-CM

## 2024-06-15 DIAGNOSIS — K7011 Alcoholic hepatitis with ascites: Secondary | ICD-10-CM

## 2024-06-15 DIAGNOSIS — F1090 Alcohol use, unspecified, uncomplicated: Secondary | ICD-10-CM | POA: Diagnosis not present

## 2024-06-15 DIAGNOSIS — E663 Overweight: Secondary | ICD-10-CM | POA: Diagnosis not present

## 2024-06-15 DIAGNOSIS — K7031 Alcoholic cirrhosis of liver with ascites: Secondary | ICD-10-CM

## 2024-06-15 DIAGNOSIS — Z6829 Body mass index (BMI) 29.0-29.9, adult: Secondary | ICD-10-CM | POA: Diagnosis not present

## 2024-06-15 MED ORDER — VITAMIN D (ERGOCALCIFEROL) 1.25 MG (50000 UNIT) PO CAPS: 50000.0000 [IU] | ORAL_CAPSULE | ORAL | 0 refills | Status: AC

## 2024-06-15 NOTE — Progress Notes (Addendum)
 " Office: [phone]  /  Fax: [phone]  WEIGHT SUMMARY AND BIOMETRICS  Weight Lost Since Last Visit: 2 lb  Weight Gained Since Last Visit: 0   Vitals Temp: 98.1 F (36.7 C) BP: 111/71 Pulse Rate: (!) 56 SpO2: 97 %   Anthropometric Measurements Height: 5' 4 (1.626 m) Weight: 173 lb (78.5 kg) BMI (Calculated): 29.68 Weight at Last Visit: 175 LB Weight Lost Since Last Visit: 2 lb Weight Gained Since Last Visit: 0 Starting Weight: 181 LB Total Weight Loss (lbs): 8 lb (3.629 kg) Peak Weight: 186 LB Waist Measurement : 40 inches   Body Composition  Body Fat %: 42.3 % Fat Mass (lbs): 73.6 lbs Muscle Mass (lbs): 95.2 lbs Total Body Water (lbs): 68.4 lbs Visceral Fat Rating : 11   Other Clinical Data Fasting: NO Labs: NO Today's Visit #: 3 Starting Date: 05/02/24    Total Weight Loss: 8 pounds Percent of body weight lost: 4.4%  Bio Impedance Data reviewed with patient: Muscle is up 0.4 pounds, adipose is down 1.6 pounds.   HPI  Chief Complaint: OBESITY  Kim Holland is here to discuss her progress with her obesity treatment plan. She is on the the Pescatarian Plan and the Vegetarian Plan and states she is following her eating plan approximately 80 % of the time. She states she is not currently exercising.   Interval History:  Since last office visit she had death of her father 2024-06-24.  Her mom is in hospice and is currently transitioning.  Her brother was also in the hospital in December. She continues to be very stressed and did eat off plan with more sweets due to stress.  She is with her mom a lot of time- no current exercise. She continues to tack on her weight watchers app.  She has been doing good with fruits and vegetables. 1 cup blueberries, 2 apples, salads with protein. She is getting maybe 44 ounces of water a day.  She does plan to get more walking in the next month.    Kim Holland does have hypertension and her BP's are currently well controlled  without medication. Denies headaches, chest pain, shortness of breath at rest and dizziness  BP Readings from Last 3 Encounters:  06/15/24 111/71  05/16/24 114/75  05/02/24 128/79   She has a history of alcoholic cirrhosis and hepatitis- she has stopped all alcohol intake.  Continues to follow regularly with hepatology. Last liver enzymes showed only a mild elevation of Alk Phos. , other liver enzymes were in normal range. Last metabolic panel Lab Results  Component Value Date   GLUCOSE 86 05/02/2024   NA 139 05/02/2024   K 4.5 05/02/2024   CL 102 05/02/2024   CO2 23 05/02/2024   BUN 10 05/02/2024   CREATININE 1.00 05/02/2024   EGFR 64 05/02/2024   CALCIUM  9.4 05/02/2024   PROT 6.9 05/02/2024   ALBUMIN 4.3 05/02/2024   LABGLOB 2.6 05/02/2024   BILITOT 0.9 05/02/2024   ALKPHOS 165 (H) 05/02/2024   AST 25 05/02/2024   ALT 17 05/02/2024   ANIONGAP 8 06/24/2022    She continues to take Atorvastatin  10 mg twice a week at bedtime for hyperlipidemia. She also is focusing on nutrition and weight loss to lower lipid levels Lab Results  Component Value Date   CHOL 193 05/02/2024   HDL 58 05/02/2024   LDLCALC 114 (H) 05/02/2024   TRIG 121 05/02/2024    She did not get prescription for once weekly  ergocalciferol  filled  from last visit but does want to start the medication Last vitamin D  Lab Results  Component Value Date   VD25OH 24.2 (L) 05/02/2024    PHYSICAL EXAM:  Blood pressure 111/71, pulse (!) 56, temperature 98.1 F (36.7 C), height 5' 4 (1.626 m), weight 173 lb (78.5 kg), SpO2 97%. Body mass index is 29.7 kg/m.  General: Well Developed, well nourished, and in no acute distress.  HEENT: Normocephalic, atraumatic; EOMI, sclerae are anicteric. Skin: Warm and dry, good turgor Chest:  Normal excursion, shape, no gross ABN Respiratory: No conversational dyspnea; speaking in full sentences NeuroM-Sk:  Normal gross ROM * 4 extremities  Psych: A and O X 3, insight  adequate, mood- full    DIAGNOSTIC DATA REVIEWED:  BMET    Component Value Date/Time   NA 139 05/02/2024 1125   K 4.5 05/02/2024 1125   CL 102 05/02/2024 1125   CO2 23 05/02/2024 1125   GLUCOSE 86 05/02/2024 1125   GLUCOSE 105 (H) 06/24/2022 0434   BUN 10 05/02/2024 1125   CREATININE 1.00 05/02/2024 1125   CALCIUM  9.4 05/02/2024 1125   GFRNONAA >60 06/24/2022 0434   GFRAA >90 12/16/2013 0652   Lab Results  Component Value Date   HGBA1C 5.4 05/02/2024   Lab Results  Component Value Date   INSULIN  18.6 05/02/2024   Lab Results  Component Value Date   TSH 1.930 05/02/2024   CBC    Component Value Date/Time   WBC 6.0 05/02/2024 1125   WBC 18.4 (H) 06/24/2022 0434   RBC 5.29 (H) 05/02/2024 1125   RBC 3.76 (L) 06/24/2022 0434   HGB 16.0 (H) 05/02/2024 1125   HCT 45.8 05/02/2024 1125   PLT 204 05/02/2024 1125   MCV 87 05/02/2024 1125   MCH 30.2 05/02/2024 1125   MCH 34.6 (H) 06/24/2022 0434   MCHC 34.9 05/02/2024 1125   MCHC 35.6 06/24/2022 0434   RDW 12.7 05/02/2024 1125   Iron Studies No results found for: IRON, TIBC, FERRITIN, IRONPCTSAT Lipid Panel     Component Value Date/Time   CHOL 193 05/02/2024 1125   TRIG 121 05/02/2024 1125   HDL 58 05/02/2024 1125   LDLCALC 114 (H) 05/02/2024 1125   Hepatic Function Panel     Component Value Date/Time   PROT 6.9 05/02/2024 1125   ALBUMIN 4.3 05/02/2024 1125   AST 25 05/02/2024 1125   ALT 17 05/02/2024 1125   ALKPHOS 165 (H) 05/02/2024 1125   BILITOT 0.9 05/02/2024 1125   BILIDIR 5.7 (H) 06/19/2022 0958   IBILI 4.8 (H) 06/19/2022 0958      Component Value Date/Time   TSH 1.930 05/02/2024 1125   Nutritional Lab Results  Component Value Date   VD25OH 24.2 (L) 05/02/2024     ASSESSMENT AND PLAN Overweight with body mass index (BMI) of 29 to 29.9 in adult TREATMENT PLAN FOR OBESITY:  Recommended Dietary Goals  Kim Holland is currently in the action stage of change. As such, her goal is to  continue weight management plan. She has agreed to the [place] and the Vegetarian Plan.  Behavioral Intervention  We discussed the following Behavioral Modification Strategies today: increasing lean protein intake to established goals, increasing fiber rich foods, avoiding skipping meals, increasing water intake , continue to work on maintaining a reduced calorie state, getting the recommended amount of protein, incorporating whole foods, making healthy choices, staying well hydrated and practicing mindfulness when eating., and increase protein intake, fibrous foods (25 grams per day for  women, 30 grams for men) and water to improve satiety and decrease hunger signals. .   Recommended Physical Activity Goals  Kim Holland has been advised to work up to 150 minutes of moderate intensity aerobic activity a week and strengthening exercises 2-3 times per week for cardiovascular health, weight loss maintenance and preservation of muscle mass.   She has agreed to Start aerobic activity with a goal of 150 minutes a week at moderate intensity.    Pharmacotherapy We discussed various medication options to help Kim Holland with her weight loss efforts and we both agreed to start Ergocalciferol  50000 units once a week for Vit D deficiency- had not picked up the prescription from last visit .  ASSOCIATED CONDITIONS ADDRESSED TODAY  Action/Plan  Alcoholic hepatitis with ascites (HCC)       Continue to follow Pescatarian/vegetarian plan.        Limit simple carbs and saturated fats       Continue to work on weight loss and incorporate exercise       Continue to follow with hepatology  Alcoholic cirrhosis of liver with ascites (HCC) Continue to follow Pescatarian/vegetarian plan.        Limit simple carbs and saturated fats       Continue to work on weight loss and incorporate exercise       Continue to follow with hepatology  Mixed hyperlipidemia Focus on implementing Pescatarian/vegetarian plan.,  limit saturated fats. TO get protein every 6 hours while awake Continue Atorvastatin  10 mg twice a week at bedtime and follow regularly with PCP Focus on getting 150 minutes a week of moderate to high intensity exercise   Essential hypertension Controlled without medication Continue Category Pescatarian/vegetarian meal plan  and DASH diet Monitor BP and if consistently >140/90 notify PCP If develops headaches, chest pain, shortness of breath or dizziness go to ER  Vitamin D  deficiency       Supplement with Ergocalciferol  50000 units once a week -     Vitamin D , Ergocalciferol , (DRISDOL ) 1.25 MG (50000 UNIT) CAPS capsule; Take 1 capsule (50,000 Units total) by mouth every 7 (seven) days.         Return in about 4 weeks (around 07/13/2024).Kim Holland She was informed of the importance of frequent follow up visits to maximize her success with intensive lifestyle modifications for her multiple health conditions.   ATTESTASTION STATEMENTS:  Reviewed by clinician on day of visit: allergies, medications, problem list, medical history, surgical history, family history, social history, and previous encounter notes.   Lonell Liverpool ANP-C "

## 2024-06-20 ENCOUNTER — Encounter: Payer: Self-pay | Admitting: Nurse Practitioner

## 2024-06-20 ENCOUNTER — Other Ambulatory Visit: Payer: Self-pay | Admitting: Nurse Practitioner

## 2024-06-20 DIAGNOSIS — K7682 Hepatic encephalopathy: Secondary | ICD-10-CM

## 2024-06-20 DIAGNOSIS — R748 Abnormal levels of other serum enzymes: Secondary | ICD-10-CM

## 2024-06-20 DIAGNOSIS — F1021 Alcohol dependence, in remission: Secondary | ICD-10-CM

## 2024-06-20 DIAGNOSIS — K766 Portal hypertension: Secondary | ICD-10-CM

## 2024-06-20 DIAGNOSIS — D376 Neoplasm of uncertain behavior of liver, gallbladder and bile ducts: Secondary | ICD-10-CM

## 2024-06-20 DIAGNOSIS — R188 Other ascites: Secondary | ICD-10-CM

## 2024-06-20 DIAGNOSIS — K7030 Alcoholic cirrhosis of liver without ascites: Secondary | ICD-10-CM

## 2024-06-27 ENCOUNTER — Ambulatory Visit
Admission: RE | Admit: 2024-06-27 | Discharge: 2024-06-27 | Disposition: A | Source: Ambulatory Visit | Attending: Nurse Practitioner

## 2024-06-27 DIAGNOSIS — R188 Other ascites: Secondary | ICD-10-CM

## 2024-06-27 DIAGNOSIS — K7682 Hepatic encephalopathy: Secondary | ICD-10-CM

## 2024-06-27 DIAGNOSIS — R748 Abnormal levels of other serum enzymes: Secondary | ICD-10-CM

## 2024-06-27 DIAGNOSIS — K766 Portal hypertension: Secondary | ICD-10-CM

## 2024-06-27 DIAGNOSIS — D376 Neoplasm of uncertain behavior of liver, gallbladder and bile ducts: Secondary | ICD-10-CM

## 2024-06-27 DIAGNOSIS — K7030 Alcoholic cirrhosis of liver without ascites: Secondary | ICD-10-CM

## 2024-06-27 DIAGNOSIS — F1021 Alcohol dependence, in remission: Secondary | ICD-10-CM

## 2024-06-27 MED ORDER — GADOPICLENOL 0.5 MMOL/ML IV SOLN
8.0000 mL | Freq: Once | INTRAVENOUS | Status: AC | PRN
  Administered 2024-06-27: 8 mL via INTRAVENOUS

## 2024-07-13 ENCOUNTER — Ambulatory Visit (INDEPENDENT_AMBULATORY_CARE_PROVIDER_SITE_OTHER): Admitting: Nurse Practitioner
